# Patient Record
Sex: Female | Born: 1960
Health system: Southern US, Community
[De-identification: ages and names within clinical notes are randomized; demographics above are authoritative.]

## PROBLEM LIST (undated history)

## (undated) DIAGNOSIS — Z9889 Other specified postprocedural states: Secondary | ICD-10-CM

## (undated) DIAGNOSIS — M1990 Unspecified osteoarthritis, unspecified site: Secondary | ICD-10-CM

## (undated) DIAGNOSIS — J309 Allergic rhinitis, unspecified: Secondary | ICD-10-CM

## (undated) DIAGNOSIS — Z8249 Family history of ischemic heart disease and other diseases of the circulatory system: Secondary | ICD-10-CM

## (undated) DIAGNOSIS — Z85828 Personal history of other malignant neoplasm of skin: Secondary | ICD-10-CM

## (undated) DIAGNOSIS — Z973 Presence of spectacles and contact lenses: Secondary | ICD-10-CM

## (undated) DIAGNOSIS — N926 Irregular menstruation, unspecified: Secondary | ICD-10-CM

## (undated) DIAGNOSIS — N840 Polyp of corpus uteri: Secondary | ICD-10-CM

## (undated) DIAGNOSIS — T7840XA Allergy, unspecified, initial encounter: Secondary | ICD-10-CM

## (undated) DIAGNOSIS — C801 Malignant (primary) neoplasm, unspecified: Secondary | ICD-10-CM

## (undated) HISTORY — DX: Family history of ischemic heart disease and other diseases of the circulatory system: Z82.49

## (undated) HISTORY — DX: Allergic rhinitis, unspecified: J30.9

## (undated) HISTORY — PX: DENTAL SURGERY: SHX609

## (undated) HISTORY — DX: Allergy, unspecified, initial encounter: T78.40XA

## (undated) HISTORY — PX: HYMENECTOMY: SHX987

## (undated) HISTORY — PX: OTHER SURGICAL HISTORY: SHX169

## (undated) HISTORY — DX: Malignant (primary) neoplasm, unspecified: C80.1

## (undated) HISTORY — DX: Unspecified osteoarthritis, unspecified site: M19.90

## (undated) HISTORY — DX: Irregular menstruation, unspecified: N92.6

---

## 1998-03-17 ENCOUNTER — Other Ambulatory Visit: Admission: RE | Admit: 1998-03-17 | Discharge: 1998-03-17 | Payer: Self-pay | Admitting: Obstetrics and Gynecology

## 1999-06-03 ENCOUNTER — Other Ambulatory Visit: Admission: RE | Admit: 1999-06-03 | Discharge: 1999-06-03 | Payer: Self-pay | Admitting: Obstetrics and Gynecology

## 2000-07-02 ENCOUNTER — Other Ambulatory Visit: Admission: RE | Admit: 2000-07-02 | Discharge: 2000-07-02 | Payer: Self-pay | Admitting: Obstetrics and Gynecology

## 2001-07-22 ENCOUNTER — Other Ambulatory Visit: Admission: RE | Admit: 2001-07-22 | Discharge: 2001-07-22 | Payer: Self-pay | Admitting: Obstetrics and Gynecology

## 2002-01-14 ENCOUNTER — Other Ambulatory Visit: Admission: RE | Admit: 2002-01-14 | Discharge: 2002-01-14 | Payer: Self-pay | Admitting: Obstetrics and Gynecology

## 2002-09-04 ENCOUNTER — Other Ambulatory Visit: Admission: RE | Admit: 2002-09-04 | Discharge: 2002-09-04 | Payer: Self-pay | Admitting: Obstetrics and Gynecology

## 2003-03-03 ENCOUNTER — Other Ambulatory Visit: Admission: RE | Admit: 2003-03-03 | Discharge: 2003-03-03 | Payer: Self-pay | Admitting: Obstetrics and Gynecology

## 2003-03-11 ENCOUNTER — Encounter: Admission: RE | Admit: 2003-03-11 | Discharge: 2003-03-11 | Payer: Self-pay | Admitting: Obstetrics and Gynecology

## 2003-09-21 ENCOUNTER — Other Ambulatory Visit: Admission: RE | Admit: 2003-09-21 | Discharge: 2003-09-21 | Payer: Self-pay | Admitting: Obstetrics and Gynecology

## 2004-02-25 ENCOUNTER — Ambulatory Visit: Payer: Self-pay | Admitting: Internal Medicine

## 2004-10-11 ENCOUNTER — Other Ambulatory Visit: Admission: RE | Admit: 2004-10-11 | Discharge: 2004-10-11 | Payer: Self-pay | Admitting: Obstetrics and Gynecology

## 2005-01-05 ENCOUNTER — Encounter: Admission: RE | Admit: 2005-01-05 | Discharge: 2005-01-05 | Payer: Self-pay | Admitting: Obstetrics and Gynecology

## 2005-02-17 ENCOUNTER — Ambulatory Visit: Payer: Self-pay | Admitting: Internal Medicine

## 2005-07-20 ENCOUNTER — Encounter: Admission: RE | Admit: 2005-07-20 | Discharge: 2005-07-20 | Payer: Self-pay | Admitting: Obstetrics and Gynecology

## 2006-12-20 ENCOUNTER — Telehealth: Payer: Self-pay | Admitting: Internal Medicine

## 2007-05-15 ENCOUNTER — Encounter: Payer: Self-pay | Admitting: *Deleted

## 2007-05-15 DIAGNOSIS — Z9189 Other specified personal risk factors, not elsewhere classified: Secondary | ICD-10-CM | POA: Insufficient documentation

## 2007-05-15 DIAGNOSIS — N926 Irregular menstruation, unspecified: Secondary | ICD-10-CM | POA: Insufficient documentation

## 2007-05-15 DIAGNOSIS — J309 Allergic rhinitis, unspecified: Secondary | ICD-10-CM | POA: Insufficient documentation

## 2010-06-22 ENCOUNTER — Encounter: Payer: Self-pay | Admitting: Internal Medicine

## 2010-06-23 ENCOUNTER — Ambulatory Visit (INDEPENDENT_AMBULATORY_CARE_PROVIDER_SITE_OTHER): Payer: BC Managed Care – PPO | Admitting: Internal Medicine

## 2010-06-23 VITALS — BP 122/78 | HR 59 | Temp 98.7°F | Wt 123.0 lb

## 2010-06-23 DIAGNOSIS — R591 Generalized enlarged lymph nodes: Secondary | ICD-10-CM

## 2010-06-23 DIAGNOSIS — R599 Enlarged lymph nodes, unspecified: Secondary | ICD-10-CM

## 2010-06-25 DIAGNOSIS — R591 Generalized enlarged lymph nodes: Secondary | ICD-10-CM | POA: Insufficient documentation

## 2010-06-25 NOTE — Progress Notes (Signed)
  Subjective:    Patient ID: Erin Stokes, female    DOB: 06/16/1960, 50 y.o.   MRN: [id number]  HPI Erin Stokes presents for evaluation of lymphadenopathy of the neck. She noticed an enlarged node at the right posterior neck 5 cm below the hairline and a second smaller node at the hairline, the later being slightly tender. She has had no prodromal illness, no scalp infections, cut or injuries, no new hair treatments. She denies any fevers, sweats, weight loss or other signs of illness. She reports that she did have transiently enlarged nodes in the anterior cervical chain and in the submandibular region that has resolved. She has had no dental problems, sore throat or skin infections.   Erin Stokes has not been seen for a decade plus. She reports no major illness, surgery or injury in the interval. She does report that she is current with breast health and gyn care.   Past Medical History  Diagnosis Date  . Family history of ischemic heart disease   . Irregular menstrual cycle   . History of chickenpox   . Allergic rhinitis, cause unspecified    Past Surgical History  Procedure Date  . Hymenectomy     Age 72   Will need to pull paper chart for additional history: family and social hx.        Review of Systems Review of Systems  Constitutional:  Negative for fever, chills, activity change and unexpected weight change.  HEENT:  Negative for hearing loss, ear pain, congestion, neck stiffness and postnasal drip. Negative for sore throat or swallowing problems. Negative for dental complaints.   Eyes: Negative for vision loss or change in visual acuity.  Respiratory: Negative for chest tightness and wheezing.   Cardiovascular: Negative for chest pain and palpitation. No decreased exercise tolerance Gastrointestinal: No change in bowel habit. No bloating or gas. No reflux or indigestion Genitourinary: Negative for urgency, frequency, flank pain and difficulty urinating.    Musculoskeletal: Negative for myalgias, back pain, arthralgias and gait problem.  Neurological: Negative for dizziness, tremors, weakness and headaches.  Hematological: Negative for adenopathy.  Psychiatric/Behavioral: Negative for behavioral problems and dysphoric mood.       Objective:   Physical Exam Vitals reviewed - afebrile Gen'l - WNWD white woman who looks younger than her stated age in no distress HEENT [place]/AT, EACs/TMs nl, no oral lesions Neck - supple, no thyromegaly. Nodes - 1 cm node right neck at the hair line, 2 cm node posterior cervical chain - soft, mobile, not tender, shotty submandibular and anterior cerivcal areas, no supraclavicular or axillary nodes. Lungs Clear Cor - RRR Derm - no lesions face,neck, arms.        Assessment & Plan:

## 2010-06-25 NOTE — Assessment & Plan Note (Signed)
Patient presents with isolated adenopathy with no signs of illness and a negative exam. May represent transient viral lymphadenitis vs other such as lymphoma.  Plan - continue use NSAIDs over the next several days.             If no improvement - smaller nodes - will refer to Narda Bonds for consideration of biopsy, either needle or excisional.

## 2010-06-27 ENCOUNTER — Telehealth: Payer: Self-pay | Admitting: *Deleted

## 2010-06-27 NOTE — Telephone Encounter (Signed)
Pt called to update the status of neck: Lumps have subsided in size & pain is almost gone. Pt does not feel that it is necessary for ROV for this matter.

## 2010-06-27 NOTE — Telephone Encounter (Signed)
Cervical nodes resolving. NO follow-up needed

## 2010-11-29 ENCOUNTER — Encounter: Payer: Self-pay | Admitting: Gastroenterology

## 2010-12-05 ENCOUNTER — Encounter: Payer: Self-pay | Admitting: Internal Medicine

## 2010-12-05 ENCOUNTER — Ambulatory Visit (INDEPENDENT_AMBULATORY_CARE_PROVIDER_SITE_OTHER): Payer: BC Managed Care – PPO | Admitting: Internal Medicine

## 2010-12-05 VITALS — BP 100/72 | HR 71 | Temp 98.4°F

## 2010-12-05 DIAGNOSIS — J069 Acute upper respiratory infection, unspecified: Secondary | ICD-10-CM

## 2010-12-05 MED ORDER — IPRATROPIUM BROMIDE 0.03 % NA SOLN: 2.0000 | Freq: Three times a day (TID) | NASAL | Status: DC

## 2010-12-05 MED ORDER — HYDROCODONE-HOMATROPINE 5-1.5 MG/5ML PO SYRP: 5.0000 mL | ORAL_SOLUTION | Freq: Four times a day (QID) | ORAL | Status: AC | PRN

## 2010-12-05 NOTE — Progress Notes (Signed)
  Subjective:    Patient ID: Erin Stokes, female    DOB: 1960-03-04, 50 y.o.   MRN: [id number]  HPI Complains of flulike symptoms: Sore throat, runny nose, myalgias Onset 5 days ago, wax/wane intensity Precipitated by sick contacts at home Symptoms not improved with over-the-counter medications  Past Medical History  Diagnosis Date  . Family history of ischemic heart disease   . Irregular menstrual cycle   . History of chickenpox   . Allergic rhinitis, cause unspecified     Review of Systems  Constitutional: Positive for fever (low grade) and chills.  HENT: Negative for nosebleeds, congestion, neck pain and sinus pressure.   Respiratory: Positive for wheezing. Negative for shortness of breath.   Genitourinary: Negative for dysuria.  Neurological: Negative for dizziness and light-headedness.       Objective:   Physical Exam BP 100/72  Pulse 71  Temp(Src) 98.4 F (36.9 C) (Oral)  SpO2 99% Wt Readings from Last 3 Encounters:  06/23/10 123 lb (55.792 kg)  02/25/04 128 lb (58.06 kg)   Constitutional: She appears well-developed and well-nourished. Mildly ill but no distress.  HENT: Head: Normocephalic and atraumatic. Ears: B TMs ok, no erythema or effusion; Nose: ruddy with clear rhinnorhea.  Mouth/Throat: Oropharynx with mild erythema, clear and moist. No oropharyngeal exudate.  Eyes: Conjunctivae and EOM are normal. Pupils are equal, round, and reactive to light. No scleral icterus.  Neck: Normal range of motion. Neck supple. No JVD present. No thyromegaly present.  Cardiovascular: Normal rate, regular rhythm and normal heart sounds.  No murmur heard. No BLE edema. Pulmonary/Chest: Effort normal and breath sounds normal. No respiratory distress. She has no wheezes.   No results found for this basename: WBC, HGB, HCT, PLT, GLUCOSE, CHOL, TRIG, HDL, LDLDIRECT, LDLCALC, ALT, AST, NA, K, CL, CREATININE, BUN, CO2, TSH, PSA, INR, GLUF, HGBA1C, MICROALBUR       Assessment &  Plan:  Viral syndrome - explain lack of efficacy of antibiotics in viral disease Symptomatic care advised and prescribed - Atrovent nasal spray for rhinorrhea and Hydromet for cough Continue ibuprofen, hydration and rest Patient to call symptoms worse or unimproved

## 2010-12-05 NOTE — Patient Instructions (Signed)
It was good to see you today. If you develop worsening symptoms or fever, call and we can reconsider antibiotics, but it does not appear necessary to use antibiotics at this time. For your cold, will use medications to treat symptoms: Atrovent nasal spray for the runny nose and Hydromet cough syrup as needed Your prescription(s) have been submitted to your pharmacy. Please take as directed and contact our office if you believe you are having problem(s) with the medication(s). Continue Advil as needed, plenty of hydration and to rest If symptoms worse or unimproved after next 7-10 days, call for reevaluation as needed

## 2010-12-08 ENCOUNTER — Telehealth: Payer: Self-pay

## 2010-12-08 NOTE — Telephone Encounter (Signed)
Called pt: right eye at the medial canthus with watery drainage, erythema. No itching , no pain, no change in vision. Mild irritation vs allergic vs viral.  Plan - otc visine for comfort            Warm compress            For itching, pain, matting - needs ov

## 2010-12-08 NOTE — Telephone Encounter (Signed)
Pt called stating she has developed redness and constant in her right eye. Pt denies itching or discharge (states viral URI sxs improving in spite of this). Pt is requesting advisement.

## 2010-12-21 ENCOUNTER — Other Ambulatory Visit: Payer: BC Managed Care – PPO | Admitting: Gastroenterology

## 2012-04-08 ENCOUNTER — Ambulatory Visit: Payer: BC Managed Care – PPO | Admitting: Internal Medicine

## 2012-04-09 ENCOUNTER — Ambulatory Visit (INDEPENDENT_AMBULATORY_CARE_PROVIDER_SITE_OTHER): Payer: BC Managed Care – PPO | Admitting: Internal Medicine

## 2012-04-09 ENCOUNTER — Encounter: Payer: Self-pay | Admitting: Internal Medicine

## 2012-04-09 VITALS — BP 118/76 | HR 92 | Temp 98.1°F | Ht 66.0 in | Wt 126.0 lb

## 2012-04-09 DIAGNOSIS — Z0000 Encounter for general adult medical examination without abnormal findings: Secondary | ICD-10-CM

## 2012-04-09 DIAGNOSIS — J209 Acute bronchitis, unspecified: Secondary | ICD-10-CM

## 2012-04-09 MED ORDER — AZITHROMYCIN 250 MG PO TABS: ORAL_TABLET | ORAL | Status: DC

## 2012-04-09 NOTE — Progress Notes (Signed)
  Subjective:    Patient ID: Erin Stokes, female    DOB: 03-20-1960, 52 y.o.   MRN: [id number]  HPI Erin Stokes presents with a many day h/o cough that is productive of opaque sputum post nasal drainage, low grade fever and a feeling chest congestion. No rigors, SOB/DOE, enlarged nodes, N/V/D. She is fatigued but able to do all her usual activities.   Past Medical History  Diagnosis Date  . Family history of ischemic heart disease   . Irregular menstrual cycle   . History of chickenpox   . Allergic rhinitis, cause unspecified    Past Surgical History  Procedure Laterality Date  . Hymenectomy      Age 19  . Basal cell cancer      removed from nose in October 2013   History reviewed. No pertinent family history. History   Social History  . Marital Status: Legally Separated    Spouse Name: N/A    Number of Children: N/A  . Years of Education: N/A   Occupational History  . Not on file.   Social History Main Topics  . Smoking status: Never Smoker   . Smokeless tobacco: Not on file  . Alcohol Use: Yes     Comment: 1 drink per day  . Drug Use: No  . Sexually Active: Not on file   Other Topics Concern  . Not on file   Social History Narrative  . No narrative on file    Current Outpatient Prescriptions on File Prior to Visit  Medication Sig Dispense Refill  . ibuprofen (ADVIL,MOTRIN) 100 MG tablet Take 100 mg by mouth every 6 (six) hours as needed.        . loratadine (CLARITIN) 10 MG tablet Take 10 mg by mouth daily.        Marland Kitchen ipratropium (ATROVENT) 0.03 % nasal spray Place 2 sprays into the nose 3 (three) times daily.  30 mL  2   No current facility-administered medications on file prior to visit.      Review of Systems System review is negative for any constitutional, cardiac, pulmonary, GI or neuro symptoms or complaints other than as described in the HPI.     Objective:   Physical Exam Filed Vitals:   04/09/12 1130  BP: 118/76  Pulse: 92  Temp: 98.1 F  (36.7 C)   Wt Readings from Last 3 Encounters:  04/09/12 126 lb (57.153 kg)  06/23/10 123 lb (55.792 kg)  02/25/04 128 lb (58.06 kg)   Gen'l- a slender woman in no distress HEENT- surgical change tip of nose; no tenderness to percussion frontal or maxillary sinus, throat clear, TMs normal Neck- supple Nodes - negative Cor - RRR Pulm - Good breath sounds w/o rales.       Assessment & Plan:  Bronchitis - early stage. Lungs are clear - no pneumonia; sinus without tenderness.  Plan Z-pak as directed  Supportive care: hydration, vitamin C 1500 mg, ecchinacea, rest, robitussin DM (or the equivalent),  Avoid decongestant.   Will return for CPX

## 2012-04-09 NOTE — Patient Instructions (Addendum)
Bronchitis - early stage. Lungs are clear - no pneumonia; sinus without tenderness.  Plan Z-pak as directed  Supportive care: hydration, vitamin C 1500 mg, ecchinacea, rest, robitussin DM (or the equivalent),  Avoid decongestant.   Acute Bronchitis You have acute bronchitis. This means you have a chest cold. The airways in your lungs are red and sore (inflamed). Acute means it is sudden onset.  CAUSES Bronchitis is most often caused by the same virus that causes a cold. SYMPTOMS   Body aches.  Chest congestion.  Chills.  Cough.  Fever.  Shortness of breath.  Sore throat. TREATMENT  Acute bronchitis is usually treated with rest, fluids, and medicines for relief of fever or cough. Most symptoms should go away after a few days or a week. Increased fluids may help thin your secretions and will prevent dehydration. Your caregiver may give you an inhaler to improve your symptoms. The inhaler reduces shortness of breath and helps control cough. You can take over-the-counter pain relievers or cough medicine to decrease coughing, pain, or fever. A cool-air vaporizer may help thin bronchial secretions and make it easier to clear your chest. Antibiotics are usually not needed but can be prescribed if you smoke, are seriously ill, have chronic lung problems, are elderly, or you are at higher risk for developing complications.Allergies and asthma can make bronchitis worse. Repeated episodes of bronchitis may cause longstanding lung problems. Avoid smoking and secondhand smoke.Exposure to cigarette smoke or irritating chemicals will make bronchitis worse. If you are a cigarette smoker, consider using nicotine gum or skin patches to help control withdrawal symptoms. Quitting smoking will help your lungs heal faster. Recovery from bronchitis is often slow, but you should start feeling better after 2 to 3 days. Cough from bronchitis frequently lasts for 3 to 4 weeks. To prevent another bout of acute  bronchitis:  Quit smoking.  Wash your hands frequently to get rid of viruses or use a hand sanitizer.  Avoid other people with cold or virus symptoms.  Try not to touch your hands to your mouth, nose, or eyes. SEEK IMMEDIATE MEDICAL CARE IF:  You develop increased fever, chills, or chest pain.  You have severe shortness of breath or bloody sputum.  You develop dehydration, fainting, repeated vomiting, or a severe headache.  You have no improvement after 1 week of treatment or you get worse. MAKE SURE YOU:   Understand these instructions.  Will watch your condition.  Will get help right away if you are not doing well or get worse. Document Released: 02/03/2004 Document Revised: 03/20/2011 Document Reviewed: 04/20/2010 [facility] Patient Information 2013 [place], [place].

## 2012-08-27 ENCOUNTER — Encounter: Payer: Self-pay | Admitting: Gastroenterology

## 2012-09-25 ENCOUNTER — Other Ambulatory Visit: Payer: Self-pay | Admitting: Obstetrics and Gynecology

## 2012-09-25 DIAGNOSIS — N63 Unspecified lump in unspecified breast: Secondary | ICD-10-CM

## 2012-10-01 ENCOUNTER — Encounter: Payer: Self-pay | Admitting: Gastroenterology

## 2012-10-01 ENCOUNTER — Ambulatory Visit (AMBULATORY_SURGERY_CENTER): Payer: Self-pay | Admitting: *Deleted

## 2012-10-01 VITALS — Ht 66.0 in | Wt 121.6 lb

## 2012-10-01 DIAGNOSIS — Z1211 Encounter for screening for malignant neoplasm of colon: Secondary | ICD-10-CM

## 2012-10-01 MED ORDER — PEG-KCL-NACL-NASULF-NA ASC-C 100 G PO SOLR: ORAL | Status: DC

## 2012-10-01 NOTE — Progress Notes (Signed)
No egg or soy allergy 

## 2012-10-07 ENCOUNTER — Encounter: Payer: Self-pay | Admitting: Internal Medicine

## 2012-10-08 ENCOUNTER — Ambulatory Visit
Admission: RE | Admit: 2012-10-08 | Discharge: 2012-10-08 | Disposition: A | Discharge status: Home / Self Care | Payer: BC Managed Care – PPO | Source: Ambulatory Visit | Attending: Obstetrics and Gynecology | Admitting: Obstetrics and Gynecology

## 2012-10-08 DIAGNOSIS — N63 Unspecified lump in unspecified breast: Secondary | ICD-10-CM

## 2012-10-14 ENCOUNTER — Encounter: Payer: Self-pay | Admitting: Gastroenterology

## 2012-10-14 ENCOUNTER — Ambulatory Visit (AMBULATORY_SURGERY_CENTER): Payer: BC Managed Care – PPO | Admitting: Gastroenterology

## 2012-10-14 VITALS — BP 126/81 | HR 55 | Temp 96.5°F | Resp 16 | Ht 66.0 in | Wt 121.0 lb

## 2012-10-14 DIAGNOSIS — Z1211 Encounter for screening for malignant neoplasm of colon: Secondary | ICD-10-CM

## 2012-10-14 DIAGNOSIS — D175 Benign lipomatous neoplasm of intra-abdominal organs: Secondary | ICD-10-CM

## 2012-10-14 MED ORDER — SODIUM CHLORIDE 0.9 % IV SOLN: 500.0000 mL | INTRAVENOUS | Status: DC

## 2012-10-14 NOTE — Progress Notes (Signed)
Patients left eye was bloodshot from admission to recovery. Patient reports a historyof the eye being reddened since last evening. Patient reporting that the eye has been blood shot like this today in the past.

## 2012-10-14 NOTE — Op Note (Signed)
[facility] [address] [place], [address]   COLONOSCOPY PROCEDURE REPORT  PATIENT: Erin Stokes, Erin Stokes  MR#: [id number] BIRTHDATE: [birth year] , 51  yrs. old GENDER: Female ENDOSCOPIST: Mardella Layman, MD, [facility] REFERRED BY: PROCEDURE DATE:  10/14/2012 PROCEDURE:   Colonoscopy, screening First Screening Colonoscopy - Avg.  risk and is 50 yrs.  old or older Yes.  Prior Negative Screening - Now for repeat screening. N/A  History of Adenoma - Now for follow-up colonoscopy & has been > or = to 3 yrs.  N/A ASA CLASS:   Class I INDICATIONS:average risk screening. MEDICATIONS: propofol (Diprivan) 250mg  IV  DESCRIPTION OF PROCEDURE:   After the risks benefits and alternatives of the procedure were thoroughly explained, informed consent was obtained.  A digital rectal exam revealed no abnormalities of the rectum.   The LB [id number]  endoscope was introduced through the anus and advanced to the cecum, which was identified by both the appendix and ileocecal valve. No adverse events experienced.   The quality of the prep was excellent, using MoviPrep  The instrument was then slowly withdrawn as the colon was fully examined.      COLON FINDINGS: 1.5 cm smooth and soft peduncalated  lipoma was found in the descending colon.See Pictures Above... Otherwise normal exam, but very,very tortuous colon noted .  Retroflexed views revealed no abnormalities. The time to cecum=8 minutes 31 seconds.  Withdrawal time=8 minutes 16 seconds.  The scope was withdrawn and the procedure completed. COMPLICATIONS: There were no complications.  ENDOSCOPIC IMPRESSION: Lpoma in the descending colon; otherwise normal but tortuous and redundant colon noted. RECOMMENDATIONS: 1.  Repeat Colonoscopy in 5 years. 2.  Continue current medications   eSigned:  Mardella Layman, MD, [facility] 10/14/2012 10:36 AM   cc:

## 2012-10-14 NOTE — Progress Notes (Signed)
PT. Right eye with redness upon admitting.

## 2012-10-14 NOTE — Progress Notes (Signed)
Patient did not experience any of the following events: a burn prior to discharge; a fall within the facility; wrong site/side/patient/procedure/implant event; or a hospital transfer or hospital admission upon discharge from the facility. (G8907) Patient did not have preoperative order for IV antibiotic SSI prophylaxis. (G8918)  

## 2012-10-14 NOTE — Patient Instructions (Signed)
YOU HAD AN ENDOSCOPIC PROCEDURE TODAY AT THE [facility]: Refer to the procedure report that was given to you for any specific questions about what was found during the examination.  If the procedure report does not answer your questions, please call your gastroenterologist to clarify.  If you requested that your care partner not be given the details of your procedure findings, then the procedure report has been included in a sealed envelope for you to review at your convenience later.  YOU SHOULD EXPECT: Some feelings of bloating in the abdomen. Passage of more gas than usual.  Walking can help get rid of the air that was put into your GI tract during the procedure and reduce the bloating. If you had a lower endoscopy (such as a colonoscopy or flexible sigmoidoscopy) you may notice spotting of blood in your stool or on the toilet paper. If you underwent a bowel prep for your procedure, then you may not have a normal bowel movement for a few days.  DIET: Your first meal following the procedure should be a light meal and then it is ok to progress to your normal diet.  A half-sandwich or bowl of soup is an example of a good first meal.  Heavy or fried foods are harder to digest and may make you feel nauseous or bloated.  Likewise meals heavy in dairy and vegetables can cause extra gas to form and this can also increase the bloating.  Drink plenty of fluids but you should avoid alcoholic beverages for 24 hours.  ACTIVITY: Your care partner should take you home directly after the procedure.  You should plan to take it easy, moving slowly for the rest of the day.  You can resume normal activity the day after the procedure however you should NOT DRIVE or use heavy machinery for 24 hours (because of the sedation medicines used during the test).    SYMPTOMS TO REPORT IMMEDIATELY: A gastroenterologist can be reached at any hour.  During normal business hours, 8:30 AM to 5:00 PM Monday through Friday,  call [phone].  After hours and on weekends, please call the GI answering service at [phone] who will take a message and have the physician on call contact you.   Following lower endoscopy (colonoscopy or flexible sigmoidoscopy):  Excessive amounts of blood in the stool  Significant tenderness or worsening of abdominal pains  Swelling of the abdomen that is new, acute  Fever of 100F or higher    FOLLOW UP: If any biopsies were taken you will be contacted by phone or by letter within the next 1-3 weeks.  Call your gastroenterologist if you have not heard about the biopsies in 3 weeks.  Our staff will call the home number listed on your records the next business day following your procedure to check on you and address any questions or concerns that you may have at that time regarding the information given to you following your procedure. This is a courtesy call and so if there is no answer at the home number and we have not heard from you through the emergency physician on call, we will assume that you have returned to your regular daily activities without incident.  SIGNATURES/CONFIDENTIALITY: You and/or your care partner have signed paperwork which will be entered into your electronic medical record.  These signatures attest to the fact that that the information above on your After Visit Summary has been reviewed and is understood.  Full responsibility of the confidentiality   of this discharge information lies with you and/or your care-partner.     

## 2012-10-14 NOTE — Progress Notes (Signed)
Procedure ends, to recovery, report given and VSS. 

## 2012-10-15 ENCOUNTER — Telehealth: Payer: Self-pay | Admitting: *Deleted

## 2012-10-15 NOTE — Telephone Encounter (Signed)
  Follow up Call-  Call back number 10/14/2012  Post procedure Call Back phone  # [phone]  Permission to leave phone message Yes     Patient questions:  Do you have a fever, pain , or abdominal swelling? no Pain Score  0 *  Have you tolerated food without any problems? yes  Have you been able to return to your normal activities? yes  Do you have any questions about your discharge instructions: Diet   no Medications  no Follow up visit  no  Do you have questions or concerns about your Care? no  Actions: * If pain score is 4 or above: No action needed, pain <4.

## 2012-11-14 ENCOUNTER — Other Ambulatory Visit: Payer: Self-pay

## 2013-02-20 ENCOUNTER — Other Ambulatory Visit: Payer: Self-pay | Admitting: Obstetrics and Gynecology

## 2013-02-20 DIAGNOSIS — N63 Unspecified lump in unspecified breast: Secondary | ICD-10-CM

## 2013-03-05 ENCOUNTER — Ambulatory Visit
Admission: RE | Admit: 2013-03-05 | Discharge: 2013-03-05 | Disposition: A | Discharge status: Home / Self Care | Payer: BC Managed Care – PPO | Source: Ambulatory Visit | Attending: Obstetrics and Gynecology | Admitting: Obstetrics and Gynecology

## 2013-03-05 ENCOUNTER — Other Ambulatory Visit: Payer: Self-pay | Admitting: Obstetrics and Gynecology

## 2013-03-05 DIAGNOSIS — N6320 Unspecified lump in the left breast, unspecified quadrant: Secondary | ICD-10-CM

## 2013-03-05 DIAGNOSIS — N644 Mastodynia: Secondary | ICD-10-CM

## 2013-03-05 DIAGNOSIS — N63 Unspecified lump in unspecified breast: Secondary | ICD-10-CM

## 2013-03-05 DIAGNOSIS — R921 Mammographic calcification found on diagnostic imaging of breast: Secondary | ICD-10-CM

## 2013-04-07 ENCOUNTER — Telehealth: Payer: Self-pay

## 2013-04-07 MED ORDER — IPRATROPIUM BROMIDE 0.03 % NA SOLN: 2.0000 | Freq: Three times a day (TID) | NASAL | Status: DC

## 2013-04-07 NOTE — Telephone Encounter (Signed)
Notified pt rx sent to [facility].../lmb 

## 2013-04-07 NOTE — Telephone Encounter (Signed)
The patient called hoping to get a refill of her Atrovent nose spray.  She stated she believes Dr.Leschber originailly filled this medication.     Callback -[phone]

## 2013-04-08 ENCOUNTER — Other Ambulatory Visit: Payer: BC Managed Care – PPO

## 2013-07-23 ENCOUNTER — Ambulatory Visit (INDEPENDENT_AMBULATORY_CARE_PROVIDER_SITE_OTHER): Payer: BC Managed Care – PPO | Admitting: Licensed Clinical Social Worker

## 2013-07-23 DIAGNOSIS — F4323 Adjustment disorder with mixed anxiety and depressed mood: Secondary | ICD-10-CM

## 2013-08-06 ENCOUNTER — Ambulatory Visit (INDEPENDENT_AMBULATORY_CARE_PROVIDER_SITE_OTHER): Payer: BC Managed Care – PPO | Admitting: Licensed Clinical Social Worker

## 2013-08-06 DIAGNOSIS — F4323 Adjustment disorder with mixed anxiety and depressed mood: Secondary | ICD-10-CM

## 2013-08-20 ENCOUNTER — Ambulatory Visit (INDEPENDENT_AMBULATORY_CARE_PROVIDER_SITE_OTHER): Payer: BC Managed Care – PPO | Admitting: Licensed Clinical Social Worker

## 2013-08-20 DIAGNOSIS — F4323 Adjustment disorder with mixed anxiety and depressed mood: Secondary | ICD-10-CM

## 2013-09-10 ENCOUNTER — Ambulatory Visit: Payer: BC Managed Care – PPO | Admitting: Licensed Clinical Social Worker

## 2013-10-10 ENCOUNTER — Ambulatory Visit (INDEPENDENT_AMBULATORY_CARE_PROVIDER_SITE_OTHER): Payer: BC Managed Care – PPO | Admitting: Licensed Clinical Social Worker

## 2013-10-10 DIAGNOSIS — F4323 Adjustment disorder with mixed anxiety and depressed mood: Secondary | ICD-10-CM

## 2013-11-10 ENCOUNTER — Ambulatory Visit (INDEPENDENT_AMBULATORY_CARE_PROVIDER_SITE_OTHER): Payer: BC Managed Care – PPO | Admitting: Licensed Clinical Social Worker

## 2013-11-10 DIAGNOSIS — F4323 Adjustment disorder with mixed anxiety and depressed mood: Secondary | ICD-10-CM

## 2013-11-20 ENCOUNTER — Other Ambulatory Visit (INDEPENDENT_AMBULATORY_CARE_PROVIDER_SITE_OTHER): Payer: BC Managed Care – PPO

## 2013-11-20 ENCOUNTER — Ambulatory Visit (INDEPENDENT_AMBULATORY_CARE_PROVIDER_SITE_OTHER): Payer: BC Managed Care – PPO | Admitting: Internal Medicine

## 2013-11-20 ENCOUNTER — Encounter: Payer: Self-pay | Admitting: Internal Medicine

## 2013-11-20 VITALS — BP 104/78 | HR 73 | Temp 98.3°F | Resp 12 | Ht 65.0 in | Wt 120.4 lb

## 2013-11-20 DIAGNOSIS — Z0000 Encounter for general adult medical examination without abnormal findings: Secondary | ICD-10-CM

## 2013-11-20 DIAGNOSIS — J302 Other seasonal allergic rhinitis: Secondary | ICD-10-CM

## 2013-11-20 LAB — LIPID PANEL
CHOLESTEROL: 243 mg/dL — AB (ref 0–200)
HDL: 116.8 mg/dL (ref >39.00)
LDL Cholesterol: 120 mg/dL — ABNORMAL HIGH (ref 0–99)
NonHDL: 126.2
TRIGLYCERIDES: 32 mg/dL (ref 0.0–149.0)
Total CHOL/HDL Ratio: 2
VLDL: 6.4 mg/dL (ref 0.0–40.0)

## 2013-11-20 LAB — BASIC METABOLIC PANEL
BUN: 16 mg/dL (ref 6–23)
CALCIUM: 9.5 mg/dL (ref 8.4–10.5)
CO2: 26 mEq/L (ref 19–32)
Chloride: 103 mEq/L (ref 96–112)
Creatinine, Ser: 0.8 mg/dL (ref 0.4–1.2)
GFR: 79.75 mL/min (ref >60.00)
Glucose, Bld: 106 mg/dL — ABNORMAL HIGH (ref 70–99)
POTASSIUM: 4.4 meq/L (ref 3.5–5.1)
Sodium: 137 mEq/L (ref 135–145)

## 2013-11-20 MED ORDER — IPRATROPIUM BROMIDE 0.03 % NA SOLN: 2.0000 | Freq: Three times a day (TID) | NASAL | Status: DC

## 2013-11-20 NOTE — Progress Notes (Signed)
   Subjective:    Patient ID: Erin Stokes, female    DOB: 02/15/1960, 53 y.o.   MRN: [id number]  HPI The patient is a 53 year old female who comes in today to establish care. She has past medical history significant only for allergies. She is up-to-date on Pap smear, mammogram. She has no complaints. She denies chest pains, shortness of breath, abdominal pain. She exercises several times daily. Her diet is healthy. She denies major joint pains although she does have some discomfort in her knees with exercise. She takes ibuprofen occasionally for that but tries not to take something every day. She denies any falls at home, balance problems.  Review of Systems  Constitutional: Negative for fever, chills, activity change, appetite change, fatigue and unexpected weight change.  HENT: Negative.   Eyes: Negative.   Respiratory: Negative for cough, chest tightness, shortness of breath and wheezing.   Cardiovascular: Negative for chest pain, palpitations and leg swelling.  Gastrointestinal: Negative for abdominal pain, diarrhea, constipation and abdominal distention.  Musculoskeletal: Positive for arthralgias. Negative for myalgias and gait problem.  Skin: Negative.   Neurological: Negative.   Psychiatric/Behavioral: Negative.       Objective:   Physical Exam  Constitutional: She is oriented to person, place, and time. She appears well-developed and well-nourished.  HENT:  Head: Normocephalic and atraumatic.  Eyes: EOM are normal.  Neck: Normal range of motion.  Cardiovascular: Normal rate, regular rhythm and intact distal pulses.   No murmur heard. Pulmonary/Chest: Effort normal and breath sounds normal. No respiratory distress. She has no wheezes. She has no rales.  Abdominal: Soft. Bowel sounds are normal.  Musculoskeletal: She exhibits no edema or tenderness.  Neurological: She is alert and oriented to person, place, and time. Coordination normal.  Skin: Skin is warm and dry.   Filed  Vitals:   11/20/13 1109 11/20/13 1128  BP: 106/98 104/78  Pulse: 73   Temp: 98.3 F (36.8 C)   TempSrc: Oral   Resp: 12   Height: 5\' 5"  (1.651 m)   Weight: 120 lb 6.4 oz (54.613 kg)   SpO2: 97%       Assessment & Plan:

## 2013-11-20 NOTE — Assessment & Plan Note (Signed)
Pap smear within the last year, mammogram last done in February was normal. She just declines flu shot. Up-to-date on tetanus shot. She regularly exercises and has a good diet. Check lipid panel, BMP today.

## 2013-11-20 NOTE — Progress Notes (Signed)
Pre visit review using our clinic review tool, if applicable. No additional management support is needed unless otherwise documented below in the visit note. 

## 2013-11-20 NOTE — Patient Instructions (Addendum)
We will check some blood work today to make sure that your cholesterol is normal. We will call you with the results.  We have refilled your medicine and if you have any problems or questions please feel free to call our office.  Come back in about 1-2 years.  Health Maintenance Adopting a healthy lifestyle and getting preventive care can go a long way to promote health and wellness. Talk with your health care provider about what schedule of regular examinations is right for you. This is a good chance for you to check in with your provider about disease prevention and staying healthy. In between checkups, there are plenty of things you can do on your own. Experts have done a lot of research about which lifestyle changes and preventive measures are most likely to keep you healthy. Ask your health care provider for more information. WEIGHT AND DIET  Eat a healthy diet  Be sure to include plenty of vegetables, fruits, low-fat dairy products, and lean protein.  Do not eat a lot of foods high in solid fats, added sugars, or salt.  Get regular exercise. This is one of the most important things you can do for your health.  Most adults should exercise for at least 150 minutes each week. The exercise should increase your heart rate and make you sweat (moderate-intensity exercise).  Most adults should also do strengthening exercises at least twice a week. This is in addition to the moderate-intensity exercise.  Maintain a healthy weight  Body mass index (BMI) is a measurement that can be used to identify possible weight problems. It estimates body fat based on height and weight. Your health care provider can help determine your BMI and help you achieve or maintain a healthy weight.  For females 63 years of age and older:   A BMI below 18.5 is considered underweight.  A BMI of 18.5 to 24.9 is normal.  A BMI of 25 to 29.9 is considered overweight.  A BMI of 30 and above is considered obese.   Watch levels of cholesterol and blood lipids  You should start having your blood tested for lipids and cholesterol at 53 years of age, then have this test every 5 years.  You may need to have your cholesterol levels checked more often if:  Your lipid or cholesterol levels are high.  You are older than 53 years of age.  You are at high risk for heart disease.  CANCER SCREENING   Lung Cancer  Lung cancer screening is recommended for adults 73-10 years old who are at high risk for lung cancer because of a history of smoking.  A yearly low-dose CT scan of the lungs is recommended for people who:  Currently smoke.  Have quit within the past 15 years.  Have at least a 30-pack-year history of smoking. A pack year is smoking an average of one pack of cigarettes a day for 1 year.  Yearly screening should continue until it has been 15 years since you quit.  Yearly screening should stop if you develop a health problem that would prevent you from having lung cancer treatment.  Breast Cancer  Practice breast self-awareness. This means understanding how your breasts normally appear and feel.  It also means doing regular breast self-exams. Let your health care provider know about any changes, no matter how small.  If you are in your 20s or 30s, you should have a clinical breast exam (CBE) by a health care provider every 1-3 years  as part of a regular health exam.  If you are 32 or older, have a CBE every year. Also consider having a breast X-ray (mammogram) every year.  If you have a family history of breast cancer, talk to your health care provider about genetic screening.  If you are at high risk for breast cancer, talk to your health care provider about having an MRI and a mammogram every year.  Breast cancer gene (BRCA) assessment is recommended for women who have family members with BRCA-related cancers. BRCA-related cancers  include:  Breast.  Ovarian.  Tubal.  Peritoneal cancers.  Results of the assessment will determine the need for genetic counseling and BRCA1 and BRCA2 testing. Cervical Cancer Routine pelvic examinations to screen for cervical cancer are no longer recommended for nonpregnant women who are considered low risk for cancer of the pelvic organs (ovaries, uterus, and vagina) and who do not have symptoms. A pelvic examination may be necessary if you have symptoms including those associated with pelvic infections. Ask your health care provider if a screening pelvic exam is right for you.   The Pap test is the screening test for cervical cancer for women who are considered at risk.  If you had a hysterectomy for a problem that was not cancer or a condition that could lead to cancer, then you no longer need Pap tests.  If you are older than 65 years, and you have had normal Pap tests for the past 10 years, you no longer need to have Pap tests.  If you have had past treatment for cervical cancer or a condition that could lead to cancer, you need Pap tests and screening for cancer for at least 20 years after your treatment.  If you no longer get a Pap test, assess your risk factors if they change (such as having a new sexual partner). This can affect whether you should start being screened again.  Some women have medical problems that increase their chance of getting cervical cancer. If this is the case for you, your health care provider may recommend more frequent screening and Pap tests.  The human papillomavirus (HPV) test is another test that may be used for cervical cancer screening. The HPV test looks for the virus that can cause cell changes in the cervix. The cells collected during the Pap test can be tested for HPV.  The HPV test can be used to screen women 28 years of age and older. Getting tested for HPV can extend the interval between normal Pap tests from three to five years.  An HPV  test also should be used to screen women of any age who have unclear Pap test results.  After 53 years of age, women should have HPV testing as often as Pap tests.  Colorectal Cancer  This type of cancer can be detected and often prevented.  Routine colorectal cancer screening usually begins at 53 years of age and continues through 53 years of age.  Your health care provider may recommend screening at an earlier age if you have risk factors for colon cancer.  Your health care provider may also recommend using home test kits to check for hidden blood in the stool.  A small camera at the end of a tube can be used to examine your colon directly (sigmoidoscopy or colonoscopy). This is done to check for the earliest forms of colorectal cancer.  Routine screening usually begins at age 60.  Direct examination of the colon should be repeated every  5-10 years through 53 years of age. However, you may need to be screened more often if early forms of precancerous polyps or small growths are found. Skin Cancer  Check your skin from head to toe regularly.  Tell your health care provider about any new moles or changes in moles, especially if there is a change in a mole's shape or color.  Also tell your health care provider if you have a mole that is larger than the size of a pencil eraser.  Always use sunscreen. Apply sunscreen liberally and repeatedly throughout the day.  Protect yourself by wearing long sleeves, pants, a wide-brimmed hat, and sunglasses whenever you are outside. HEART DISEASE, DIABETES, AND HIGH BLOOD PRESSURE   Have your blood pressure checked at least every 1-2 years. High blood pressure causes heart disease and increases the risk of stroke.  If you are between 2 years and 56 years old, ask your health care provider if you should take aspirin to prevent strokes.  Have regular diabetes screenings. This involves taking a blood sample to check your fasting blood sugar  level.  If you are at a normal weight and have a low risk for diabetes, have this test once every three years after 53 years of age.  If you are overweight and have a high risk for diabetes, consider being tested at a younger age or more often. PREVENTING INFECTION  Hepatitis B  If you have a higher risk for hepatitis B, you should be screened for this virus. You are considered at high risk for hepatitis B if:  You were born in a country where hepatitis B is common. Ask your health care provider which countries are considered high risk.  Your parents were born in a high-risk country, and you have not been immunized against hepatitis B (hepatitis B vaccine).  You have HIV or AIDS.  You use needles to inject street drugs.  You live with someone who has hepatitis B.  You have had sex with someone who has hepatitis B.  You get hemodialysis treatment.  You take certain medicines for conditions, including cancer, organ transplantation, and autoimmune conditions. Hepatitis C  Blood testing is recommended for:  Everyone born from [birth year] through 1965.  Anyone with known risk factors for hepatitis C. Sexually transmitted infections (STIs)  You should be screened for sexually transmitted infections (STIs) including gonorrhea and chlamydia if:  You are sexually active and are younger than 53 years of age.  You are older than 53 years of age and your health care provider tells you that you are at risk for this type of infection.  Your sexual activity has changed since you were last screened and you are at an increased risk for chlamydia or gonorrhea. Ask your health care provider if you are at risk.  If you do not have HIV, but are at risk, it may be recommended that you take a prescription medicine daily to prevent HIV infection. This is called pre-exposure prophylaxis (PrEP). You are considered at risk if:  You are sexually active and do not regularly use condoms or know the HIV status  of your partner(s).  You take drugs by injection.  You are sexually active with a partner who has HIV. Talk with your health care provider about whether you are at high risk of being infected with HIV. If you choose to begin PrEP, you should first be tested for HIV. You should then be tested every 3 months for as long as you  are taking PrEP.  PREGNANCY   If you are premenopausal and you may become pregnant, ask your health care provider about preconception counseling.  If you may become pregnant, take 400 to 800 micrograms (mcg) of folic acid every day.  If you want to prevent pregnancy, talk to your health care provider about birth control (contraception). OSTEOPOROSIS AND MENOPAUSE   Osteoporosis is a disease in which the bones lose minerals and strength with aging. This can result in serious bone fractures. Your risk for osteoporosis can be identified using a bone density scan.  If you are 74 years of age or older, or if you are at risk for osteoporosis and fractures, ask your health care provider if you should be screened.  Ask your health care provider whether you should take a calcium or vitamin D supplement to lower your risk for osteoporosis.  Menopause may have certain physical symptoms and risks.  Hormone replacement therapy may reduce some of these symptoms and risks. Talk to your health care provider about whether hormone replacement therapy is right for you.  HOME CARE INSTRUCTIONS   Schedule regular health, dental, and eye exams.  Stay current with your immunizations.   Do not use any tobacco products including cigarettes, chewing tobacco, or electronic cigarettes.  If you are pregnant, do not drink alcohol.  If you are breastfeeding, limit how much and how often you drink alcohol.  Limit alcohol intake to no more than 1 drink per day for nonpregnant women. One drink equals 12 ounces of beer, 5 ounces of wine, or 1 ounces of hard liquor.  Do not use street  drugs.  Do not share needles.  Ask your health care provider for help if you need support or information about quitting drugs.  Tell your health care provider if you often feel depressed.  Tell your health care provider if you have ever been abused or do not feel safe at home. Document Released: 07/11/2010 Document Revised: 05/12/2013 Document Reviewed: 11/27/2012 [facility] Patient Information 2015 [place], [place]. This information is not intended to replace advice given to you by your health care provider. Make sure you discuss any questions you have with your health care provider.

## 2013-11-20 NOTE — Assessment & Plan Note (Signed)
Refill Atrovent, continue Claritin.

## 2013-12-09 ENCOUNTER — Ambulatory Visit: Payer: BC Managed Care – PPO | Admitting: Internal Medicine

## 2014-02-09 ENCOUNTER — Ambulatory Visit: Payer: BC Managed Care – PPO | Admitting: Internal Medicine

## 2014-06-15 NOTE — H&P (Signed)
Erin Stokes, Erin Stokes               ACCOUNT NO.:  [id number]  MEDICAL RECORD NO.:  [id number]  LOCATION:                                 FACILITY:  PHYSICIAN:  Darlyn Chamber, M.D.   DATE OF BIRTH:  1960/03/20  DATE OF ADMISSION:  06/24/2014 DATE OF DISCHARGE:                             HISTORY & PHYSICAL   Date of her surgery is June 24, 2014, at [facility] area in [place]:  The patient is a 54 year old gravida 2, para 2 postmenopausal patient presents for hysteroscopy due to the finding of an endometrial polyp.  She also had a benign 3.3 cm cyst of the right ovary.  She had experienced some postmenopausal bleeding and underwent a saline infusion ultrasound on May 18, 2014, finding the polyp and she now presents for hysteroscopy.  ALLERGIES:  She is allergic to CELEBREX.  MEDICATIONS:  She is on Claritin, Atrovent, and Advil.  PAST MEDICAL HISTORY:  Usual childhood diseases.  No significant sequelae.  Does have a history of skin cancer.  SURGICAL HISTORY:  She has had a previous hymenectomy.  She has had 2 vaginal deliveries.  SOCIAL HISTORY:  No tobacco, occasional alcohol use.  FAMILY HISTORY:  Noncontributory.  REVIEW OF SYSTEMS:  Noncontributory.  PHYSICAL EXAMINATION:  VITAL SIGNS:  The patient is afebrile with stable vital signs. HEENT:  The patient is normocephalic.  Pupils equal, round, reactive to light and accommodation.  Extraocular movements were intact.  Sclerae and conjunctivae are clear. Oropharynx clear. NECK:  Without thyromegaly. BREASTS:  Not examined. LUNGS:  Clear. CARDIOVASCULAR SYSTEM:  Regular rhythm and rate without murmurs or gallops.  There were no carotid or abdominal bruits. ABDOMEN:  Benign.  No mass, organomegaly, or tenderness. PELVIC:  Normal external genitalia.  Vaginal mucosa is clear.  Cervix unremarkable.  Uterus normal size, shape, and contour.  Adnexa free of mass or  tenderness. EXTREMITIES:  Trace edema. NEUROLOGIC:  Grossly within normal limits.  IMPRESSION:  Postmenopausal bleeding with endometrial polyp.  PLAN:  The patient will have hysteroscopic evaluation, MyoSure resection of endometrial polyp.  She is also being with a dilation and curettage. Risks of procedure have been discussed including the risk of infection. The risk of vascular injury, this could lead to hemorrhage requiring transfusion with the risk of AIDS or hepatitis.  Excessive bleeding could require hysterectomy.  There is a risk of perforation with injury to adjacent organs such as bowel that could require further exploratory surgery.  Risk of deep venous thrombosis and pulmonary embolus.  The patient expressed understanding of indications and risks.     Darlyn Chamber, M.D.     JSM/MEDQ  D:  06/15/2014  T:  06/15/2014  Job:  [id number]

## 2014-06-19 ENCOUNTER — Encounter (HOSPITAL_BASED_OUTPATIENT_CLINIC_OR_DEPARTMENT_OTHER): Payer: Self-pay | Admitting: *Deleted

## 2014-06-22 ENCOUNTER — Encounter (HOSPITAL_BASED_OUTPATIENT_CLINIC_OR_DEPARTMENT_OTHER): Payer: Self-pay | Admitting: *Deleted

## 2014-06-22 NOTE — Progress Notes (Signed)
Pt instructed npo pmn 6/14 x claritin w sip of water.  Ok to take atrovent.  To [facility] 6/15 @ 0700.  Needs cbc on arrival

## 2014-07-09 ENCOUNTER — Encounter (HOSPITAL_BASED_OUTPATIENT_CLINIC_OR_DEPARTMENT_OTHER): Payer: Self-pay | Admitting: *Deleted

## 2014-07-09 NOTE — Progress Notes (Signed)
NPO AFTER MN.  ARRIVE AT 0600. NEEDS CBC.  WILL TAKE CLARITIN AND DO ATROVENT NASAL SPRAY AM DOS W/ SIPS OF WATER.

## 2014-07-15 ENCOUNTER — Encounter (HOSPITAL_BASED_OUTPATIENT_CLINIC_OR_DEPARTMENT_OTHER): Payer: Self-pay | Admitting: Anesthesiology

## 2014-07-15 NOTE — Anesthesia Preprocedure Evaluation (Addendum)
Anesthesia Evaluation  Patient identified by MRN, date of birth, ID band Patient awake    Reviewed: Allergy & Precautions, NPO status , Patient's Chart, lab work & pertinent test results  Airway Mallampati: II  TM Distance: >3 FB Neck ROM: Full    Dental no notable dental hx.    Pulmonary neg pulmonary ROS,  breath sounds clear to auscultation  Pulmonary exam normal       Cardiovascular negative cardio ROS Normal cardiovascular examRhythm:Regular Rate:Normal     Neuro/Psych negative neurological ROS  negative psych ROS   GI/Hepatic negative GI ROS, Neg liver ROS,   Endo/Other  negative endocrine ROS  Renal/GU negative Renal ROS  negative genitourinary   Musculoskeletal negative musculoskeletal ROS (+)   Abdominal   Peds negative pediatric ROS (+)  Hematology negative hematology ROS (+)   Anesthesia Other Findings   Reproductive/Obstetrics negative OB ROS                             Anesthesia Physical Anesthesia Plan  ASA: I  Anesthesia Plan: MAC   Post-op Pain Management:    Induction: Intravenous  Airway Management Planned: Natural Airway  Additional Equipment:   Intra-op Plan:   Post-operative Plan:   Informed Consent: I have reviewed the patients History and Physical, chart, labs and discussed the procedure including the risks, benefits and alternatives for the proposed anesthesia with the patient or authorized representative who has indicated his/her understanding and acceptance.   Dental advisory given  Plan Discussed with: CRNA  Anesthesia Plan Comments: (Discussed general and MAC and she prefers MAC)       Anesthesia Quick Evaluation

## 2014-07-16 ENCOUNTER — Ambulatory Visit (HOSPITAL_BASED_OUTPATIENT_CLINIC_OR_DEPARTMENT_OTHER): Payer: BLUE CROSS/BLUE SHIELD | Admitting: Anesthesiology

## 2014-07-16 ENCOUNTER — Ambulatory Visit (HOSPITAL_BASED_OUTPATIENT_CLINIC_OR_DEPARTMENT_OTHER)
Admission: RE | Admit: 2014-07-16 | Discharge: 2014-07-16 | Disposition: A | Discharge status: Home / Self Care | Payer: BLUE CROSS/BLUE SHIELD | Source: Ambulatory Visit | Attending: Obstetrics and Gynecology | Admitting: Obstetrics and Gynecology

## 2014-07-16 ENCOUNTER — Encounter (HOSPITAL_BASED_OUTPATIENT_CLINIC_OR_DEPARTMENT_OTHER): Payer: Self-pay | Admitting: Anesthesiology

## 2014-07-16 ENCOUNTER — Encounter (HOSPITAL_BASED_OUTPATIENT_CLINIC_OR_DEPARTMENT_OTHER)
Admission: RE | Disposition: A | Discharge status: Home / Self Care | Payer: Self-pay | Source: Ambulatory Visit | Attending: Obstetrics and Gynecology

## 2014-07-16 DIAGNOSIS — N950 Postmenopausal bleeding: Secondary | ICD-10-CM | POA: Diagnosis not present

## 2014-07-16 DIAGNOSIS — N8320 Unspecified ovarian cysts: Secondary | ICD-10-CM | POA: Insufficient documentation

## 2014-07-16 DIAGNOSIS — N856 Intrauterine synechiae: Secondary | ICD-10-CM | POA: Insufficient documentation

## 2014-07-16 DIAGNOSIS — Z791 Long term (current) use of non-steroidal anti-inflammatories (NSAID): Secondary | ICD-10-CM | POA: Insufficient documentation

## 2014-07-16 DIAGNOSIS — N840 Polyp of corpus uteri: Secondary | ICD-10-CM | POA: Diagnosis present

## 2014-07-16 DIAGNOSIS — Z79899 Other long term (current) drug therapy: Secondary | ICD-10-CM | POA: Diagnosis not present

## 2014-07-16 HISTORY — DX: Polyp of corpus uteri: N84.0

## 2014-07-16 HISTORY — PX: DILATATION & CURETTAGE/HYSTEROSCOPY WITH MYOSURE: SHX6511

## 2014-07-16 HISTORY — DX: Presence of spectacles and contact lenses: Z97.3

## 2014-07-16 HISTORY — DX: Other specified postprocedural states: Z98.890

## 2014-07-16 HISTORY — DX: Personal history of other malignant neoplasm of skin: Z85.828

## 2014-07-16 LAB — CBC
HEMATOCRIT: 41.1 % (ref 36.0–46.0)
Hemoglobin: 13.7 g/dL (ref 12.0–15.0)
MCH: 30.7 pg (ref 26.0–34.0)
MCHC: 33.3 g/dL (ref 30.0–36.0)
MCV: 92.2 fL (ref 78.0–100.0)
Platelets: 189 10*3/uL (ref 150–400)
RBC: 4.46 MIL/uL (ref 3.87–5.11)
RDW: 13 % (ref 11.5–15.5)
WBC: 4.1 10*3/uL (ref 4.0–10.5)

## 2014-07-16 SURGERY — DILATATION & CURETTAGE/HYSTEROSCOPY WITH MYOSURE
Anesthesia: Monitor Anesthesia Care | Site: Uterus

## 2014-07-16 MED ORDER — SODIUM CHLORIDE 0.9 % IR SOLN
Status: DC | PRN
  Administered 2014-07-16: 3000 mL

## 2014-07-16 MED ORDER — FENTANYL CITRATE (PF) 100 MCG/2ML IJ SOLN
INTRAMUSCULAR | Status: DC | PRN
  Administered 2014-07-16: 25 ug via INTRAVENOUS
  Administered 2014-07-16: 12.5 ug via INTRAVENOUS
  Administered 2014-07-16: 25 ug via INTRAVENOUS

## 2014-07-16 MED ORDER — FENTANYL CITRATE (PF) 250 MCG/5ML IJ SOLN: INTRAMUSCULAR | Status: DC | PRN

## 2014-07-16 MED ORDER — ACETAMINOPHEN 10 MG/ML IV SOLN
INTRAVENOUS | Status: DC | PRN
  Administered 2014-07-16: 1000 mg via INTRAVENOUS

## 2014-07-16 MED ORDER — MIDAZOLAM HCL 2 MG/2ML IJ SOLN
INTRAMUSCULAR | Status: AC
  Filled 2014-07-16: qty 2

## 2014-07-16 MED ORDER — CEFAZOLIN SODIUM 1-5 GM-% IV SOLN
INTRAVENOUS | Status: AC
  Filled 2014-07-16: qty 100

## 2014-07-16 MED ORDER — LACTATED RINGERS IV SOLN
INTRAVENOUS | Status: DC
  Administered 2014-07-16: 07:00:00 via INTRAVENOUS
  Filled 2014-07-16: qty 1000

## 2014-07-16 MED ORDER — ONDANSETRON HCL 4 MG/2ML IJ SOLN
INTRAMUSCULAR | Status: DC | PRN
  Administered 2014-07-16: 4 mg via INTRAVENOUS

## 2014-07-16 MED ORDER — DEXAMETHASONE SODIUM PHOSPHATE 4 MG/ML IJ SOLN
INTRAMUSCULAR | Status: DC | PRN
  Administered 2014-07-16: 10 mg via INTRAVENOUS

## 2014-07-16 MED ORDER — PROPOFOL 500 MG/50ML IV EMUL
INTRAVENOUS | Status: DC | PRN
  Administered 2014-07-16: 200 ug/kg/min via INTRAVENOUS

## 2014-07-16 MED ORDER — FENTANYL CITRATE (PF) 100 MCG/2ML IJ SOLN
25.0000 ug | INTRAMUSCULAR | Status: DC | PRN
  Filled 2014-07-16: qty 1

## 2014-07-16 MED ORDER — MIDAZOLAM HCL 5 MG/5ML IJ SOLN
INTRAMUSCULAR | Status: DC | PRN
  Administered 2014-07-16: 2 mg via INTRAVENOUS

## 2014-07-16 MED ORDER — OXYCODONE-ACETAMINOPHEN 7.5-325 MG PO TABS: 1.0000 | ORAL_TABLET | ORAL | Status: DC | PRN

## 2014-07-16 MED ORDER — KETAMINE HCL 10 MG/ML IJ SOLN
INTRAMUSCULAR | Status: AC
  Filled 2014-07-16: qty 1

## 2014-07-16 MED ORDER — SODIUM CHLORIDE 0.9 % IV SOLN
250.0000 mg | INTRAVENOUS | Status: DC | PRN
  Administered 2014-07-16: 10 ug/kg/min via INTRAVENOUS

## 2014-07-16 MED ORDER — CEFAZOLIN SODIUM-DEXTROSE 2-3 GM-% IV SOLR
2.0000 g | INTRAVENOUS | Status: AC
  Administered 2014-07-16: 2 g via INTRAVENOUS
  Filled 2014-07-16: qty 50

## 2014-07-16 MED ORDER — LIDOCAINE HCL (CARDIAC) 20 MG/ML IV SOLN
INTRAVENOUS | Status: DC | PRN
  Administered 2014-07-16: 50 mg via INTRAVENOUS

## 2014-07-16 MED ORDER — LIDOCAINE-EPINEPHRINE 1 %-1:100000 IJ SOLN
INTRAMUSCULAR | Status: DC | PRN
  Administered 2014-07-16: 20 mL

## 2014-07-16 MED ORDER — PROMETHAZINE HCL 25 MG/ML IJ SOLN
6.2500 mg | INTRAMUSCULAR | Status: DC | PRN
  Filled 2014-07-16: qty 1

## 2014-07-16 MED ORDER — FENTANYL CITRATE (PF) 100 MCG/2ML IJ SOLN
INTRAMUSCULAR | Status: AC
Start: 2014-07-16 — End: 2014-07-16
  Filled 2014-07-16: qty 2

## 2014-07-16 SURGICAL SUPPLY — 32 items
CANISTER SUCTION 2500CC (MISCELLANEOUS) ×2 IMPLANT
CATH ROBINSON RED A/P 16FR (CATHETERS) ×2 IMPLANT
CLOTH BEACON ORANGE TIMEOUT ST (SAFETY) ×2 IMPLANT
CORD ACTIVE DISPOSABLE (ELECTRODE)
CORD ELECTRO ACTIVE DISP (ELECTRODE) IMPLANT
COVER BACK TABLE 60X90IN (DRAPES) ×2 IMPLANT
DEVICE MYOSURE CLASSIC (MISCELLANEOUS) IMPLANT
DEVICE MYOSURE LITE (MISCELLANEOUS) ×2 IMPLANT
DRAPE LG THREE QUARTER DISP (DRAPES) ×2 IMPLANT
DRSG TELFA 3X8 NADH (GAUZE/BANDAGES/DRESSINGS) ×2 IMPLANT
ELECT LOOP GYNE PRO 24FR (CUTTING LOOP)
ELECT REM PT RETURN 9FT ADLT (ELECTROSURGICAL) ×2
ELECT VAPORTRODE GRVD BAR (ELECTRODE) IMPLANT
ELECTRODE LOOP GYNE PRO 24FR (CUTTING LOOP) IMPLANT
ELECTRODE REM PT RTRN 9FT ADLT (ELECTROSURGICAL) ×1 IMPLANT
ELECTRODE VAPORCUT 22FR (ELECTROSURGICAL) IMPLANT
GLOVE BIO SURGEON STRL SZ 6.5 (GLOVE) ×2 IMPLANT
GLOVE BIO SURGEON STRL SZ7 (GLOVE) ×6 IMPLANT
GLOVE INDICATOR 6.5 STRL GRN (GLOVE) ×2 IMPLANT
GLOVE INDICATOR 7.0 STRL GRN (GLOVE) ×2 IMPLANT
GOWN STRL REUS W/ TWL LRG LVL3 (GOWN DISPOSABLE) ×2 IMPLANT
GOWN STRL REUS W/TWL LRG LVL3 (GOWN DISPOSABLE) ×2
LEGGING LITHOTOMY PAIR STRL (DRAPES) ×2 IMPLANT
MANIFOLD NEPTUNE II (INSTRUMENTS) IMPLANT
NEEDLE SPNL 18GX3.5 QUINCKE PK (NEEDLE) ×2 IMPLANT
PACK BASIN DAY SURGERY FS (CUSTOM PROCEDURE TRAY) ×2 IMPLANT
PAD OB MATERNITY 4.3X12.25 (PERSONAL CARE ITEMS) ×2 IMPLANT
PAD PREP 24X48 CUFFED NSTRL (MISCELLANEOUS) ×2 IMPLANT
SEAL ROD LENS SCOPE MYOSURE (ABLATOR) ×2 IMPLANT
SYR CONTROL 10ML LL (SYRINGE) ×2 IMPLANT
TOWEL OR 17X24 6PK STRL BLUE (TOWEL DISPOSABLE) ×4 IMPLANT
WATER STERILE IRR 500ML POUR (IV SOLUTION) ×2 IMPLANT

## 2014-07-16 NOTE — H&P (Signed)
  History and physical exam unchanged 

## 2014-07-16 NOTE — Op Note (Signed)
[name], [name]               ACCOUNT NO.:  [id number]  MEDICAL RECORD NO.:  [id number]  LOCATION:                                 FACILITY:  PHYSICIAN:  Darlyn Chamber, M.D.        DATE OF BIRTH:  DATE OF PROCEDURE:  07/16/2014 DATE OF DISCHARGE:                              OPERATIVE REPORT   PREOPERATIVE DIAGNOSIS:  Endometrial polyp.  POSTOPERATIVE DIAGNOSIS:  Intrauterine synechiae.  OPERATIVE PROCEDURE:  Paracervical block.  Cervical dilatation, hysteroscopy.  Endometrial resections and curettings.  SURGEON:  Darlyn Chamber, MD  ANESTHESIA:  Paracervical block and sedation.  BLOOD LOSS:  Minimal.  PACKS AND DRAINS:  None.  INTRAOPERATIVE BLOOD PLACED:  None.  COMPLICATIONS:  None.  INDICATION:  Dictated in history and physical.  PROCEDURE IN DETAIL:  The patient was taken to the OR and placed in supine position.  After satisfactory level of sedation, she was placed in a dorsal lithotomy position using the Allen stirrups.  Perineum and vagina were prepped out with Betadine.  She was then draped as a sterile field.  A speculum was placed in the vaginal vault.  Cervix was grasped with a single-tooth tenaculum.  Uterus sounded to approximately 9 cm. Cervix serially dilated to a size 25 Pratt dilator.  Hysteroscope was introduced.  Intrauterine cavity was distended using saline. Visualization revealed relatively smooth atrophic endometrium.  She had a synechiae or a scarring from the anterior to posterior wall of the uterine fundus that was probably showing up as a polyp.  We brought in the MyoSure, were able to resect the synechiae and we did multiple endometrial samplings.  At the end of this procedure, we felt like we had adequate sampling.  No other abnormalities were noted.  The hysteroscope was removed.  Endometrial curettings were obtained.  Single-tooth tenaculum and speculum were then removed.  The patient taken out of dorsal lithotomy position.   Once alert, transferred to recovery room in good condition.  Sponge, instrument, and needle count was correct by circulating nurse.     Darlyn Chamber, M.D.     JSM/MEDQ  D:  07/16/2014  T:  07/16/2014  Job:  [id number]

## 2014-07-16 NOTE — Brief Op Note (Signed)
07/16/2014  7:49 AM  PATIENT:  Erin Stokes  54 y.o. female  PRE-OPERATIVE DIAGNOSIS:  POLYP  POST-OPERATIVE DIAGNOSIS:  * No post-op diagnosis entered *  PROCEDURE:  Procedure(s): DILATATION & CURETTAGE/HYSTEROSCOPY WITH MYOSURE (N/A)  SURGEON:  Surgeon(s) and Role:    * Arvella Nigh, MD - Primary  PHYSICIAN ASSISTANT:   ASSISTANTS: none   ANESTHESIA:   IV sedation and paracervical block  EBL:     BLOOD ADMINISTERED:none  DRAINS: none   LOCAL MEDICATIONS USED:  XYLOCAINE  and Amount: 20 ml  SPECIMEN:  Source of Specimen:  endometrial resections and currettings  DISPOSITION OF SPECIMEN:  PATHOLOGY  COUNTS:  YES  TOURNIQUET:  * No tourniquets in log *  DICTATION: .Other Dictation: Dictation Number [id number]  PLAN OF CARE: Discharge to home after PACU  PATIENT DISPOSITION:  PACU - hemodynamically stable.   Delay start of Pharmacological VTE agent (>24hrs) due to surgical blood loss or risk of bleeding: not applicable

## 2014-07-16 NOTE — Discharge Instructions (Signed)

## 2014-07-16 NOTE — Transfer of Care (Signed)
Immediate Anesthesia Transfer of Care Note  Patient: Erin Stokes  Procedure(s) Performed: Procedure(s): DILATATION & CURETTAGE/HYSTEROSCOPY WITH MYOSURE (N/A)  Patient Location: PACU  Anesthesia Type:MAC  Level of Consciousness: awake, alert , oriented and patient cooperative  Airway & Oxygen Therapy: Patient Spontanous Breathing and Patient connected to nasal cannula oxygen  Post-op Assessment: Report given to RN and Post -op Vital signs reviewed and stable  Post vital signs: Reviewed and stable  Last Vitals:  Filed Vitals:   07/16/14 0608  BP: 103/63  Pulse: 65  Temp: 36.9 C  Resp: 14    Complications: No apparent anesthesia complications

## 2014-07-16 NOTE — Anesthesia Postprocedure Evaluation (Signed)
  Anesthesia Post-op Note  Patient: Erin Stokes  Procedure(s) Performed: Procedure(s) (LRB): DILATATION & CURETTAGE/HYSTEROSCOPY WITH MYOSURE (N/A)  Patient Location: PACU  Anesthesia Type: MAC  Level of Consciousness: awake and alert   Airway and Oxygen Therapy: Patient Spontanous Breathing  Post-op Pain: mild  Post-op Assessment: Post-op Vital signs reviewed, Patient's Cardiovascular Status Stable, Respiratory Function Stable, Patent Airway and No signs of Nausea or vomiting  Last Vitals:  Filed Vitals:   07/16/14 0843  BP:   Pulse: 59  Temp:   Resp: 11    Post-op Vital Signs: stable   Complications: No apparent anesthesia complications

## 2014-07-16 NOTE — Anesthesia Procedure Notes (Signed)
Procedure Name: MAC Date/Time: 07/16/2014 7:31 AM Performed by: Wanita Chamberlain Pre-anesthesia Checklist: Patient identified, Timeout performed, Emergency Drugs available, Suction available and Patient being monitored Patient Re-evaluated:Patient Re-evaluated prior to inductionOxygen Delivery Method: Nasal cannula Preoxygenation: Pre-oxygenation with 100% oxygen Intubation Type: IV induction Placement Confirmation: positive ETCO2 Dental Injury: Teeth and Oropharynx as per pre-operative assessment

## 2014-07-17 ENCOUNTER — Encounter (HOSPITAL_BASED_OUTPATIENT_CLINIC_OR_DEPARTMENT_OTHER): Payer: Self-pay | Admitting: Obstetrics and Gynecology

## 2014-12-17 ENCOUNTER — Other Ambulatory Visit: Payer: Self-pay | Admitting: Internal Medicine

## 2015-01-18 ENCOUNTER — Other Ambulatory Visit: Payer: Self-pay

## 2015-01-18 MED ORDER — IPRATROPIUM BROMIDE 0.03 % NA SOLN: 1.0000 | Freq: Three times a day (TID) | NASAL | Status: DC

## 2015-01-18 NOTE — Telephone Encounter (Signed)
Pt LMOM rq rf for nasal spray

## 2015-03-05 ENCOUNTER — Encounter: Payer: Self-pay | Admitting: Internal Medicine

## 2015-03-05 ENCOUNTER — Ambulatory Visit (INDEPENDENT_AMBULATORY_CARE_PROVIDER_SITE_OTHER): Payer: BLUE CROSS/BLUE SHIELD | Admitting: Internal Medicine

## 2015-03-05 VITALS — BP 108/62 | HR 73 | Temp 98.6°F | Resp 14 | Ht 65.5 in | Wt 119.1 lb

## 2015-03-05 DIAGNOSIS — H6690 Otitis media, unspecified, unspecified ear: Secondary | ICD-10-CM | POA: Diagnosis not present

## 2015-03-05 NOTE — Progress Notes (Signed)
   Subjective:    Patient ID: Erin Stokes, female    DOB: 05-02-1960, 55 y.o.   MRN: [id number]  HPI The patient is a 55 YO female coming in for left ear pain. She was just treated for ear infection with amoxicillin and is not fully better. Still with intact hearing and denies tinnitus. Some pressure and feels like fluid in the ears. Finished the amoxicillin yesterday and was also given some ear drops which she has not been using well. No fevers or chills.   Review of Systems  Constitutional: Positive for activity change. Negative for fever, chills, appetite change, fatigue and unexpected weight change.  HENT: Positive for ear discharge and ear pain. Negative for congestion, postnasal drip, rhinorrhea, sinus pressure, sore throat and trouble swallowing.   Eyes: Negative.   Respiratory: Negative for cough, chest tightness, shortness of breath and wheezing.   Cardiovascular: Negative for chest pain, palpitations and leg swelling.  Gastrointestinal: Negative.       Objective:   Physical Exam  Constitutional: She appears well-developed and well-nourished.  HENT:  Head: Normocephalic and atraumatic.  Right Ear: External ear normal.  Left ear with inflammation and fluid in the eardrum, no purulent material and no perforation of the TM. Right ear normal, oropharynx normal.   Eyes: EOM are normal.  Neck: Normal range of motion.  Cardiovascular: Normal rate and regular rhythm.   Pulmonary/Chest: Effort normal and breath sounds normal.  Abdominal: Soft. She exhibits no distension. There is no tenderness.   Filed Vitals:   03/05/15 1310  BP: 108/62  Pulse: 73  Temp: 98.6 F (37 C)  TempSrc: Oral  Resp: 14  Height: 5' 5.5" (1.664 m)  Weight: 119 lb 1.9 oz (54.032 kg)  SpO2: 98%      Assessment & Plan:

## 2015-03-05 NOTE — Assessment & Plan Note (Signed)
Finished amoxicillin and did not take her ear drops (looked up for english equivalent and is corticosporin). Advised to take those 3 drops TID for 3 days to help with the residual inflammation.

## 2015-03-05 NOTE — Progress Notes (Signed)
Pre visit review using our clinic review tool, if applicable. No additional management support is needed unless otherwise documented below in the visit note. 

## 2015-03-05 NOTE — Patient Instructions (Signed)
Use the drops as prescribed for 3 more days to help the inflammation in the ear.   It is okay to try a decongestant to see if that helps with the symptoms.   You should be better in 1 week, if not call us for more antibiotics.

## 2015-04-26 ENCOUNTER — Ambulatory Visit: Payer: BLUE CROSS/BLUE SHIELD

## 2015-04-28 ENCOUNTER — Other Ambulatory Visit: Payer: Self-pay | Admitting: Geriatric Medicine

## 2015-04-28 ENCOUNTER — Ambulatory Visit (INDEPENDENT_AMBULATORY_CARE_PROVIDER_SITE_OTHER): Payer: BLUE CROSS/BLUE SHIELD | Admitting: Geriatric Medicine

## 2015-04-28 DIAGNOSIS — Z23 Encounter for immunization: Secondary | ICD-10-CM

## 2015-04-28 MED ORDER — IPRATROPIUM BROMIDE 0.03 % NA SOLN: 1.0000 | Freq: Three times a day (TID) | NASAL | Status: DC

## 2015-05-19 DIAGNOSIS — L84 Corns and callosities: Secondary | ICD-10-CM | POA: Diagnosis not present

## 2015-05-19 DIAGNOSIS — D2262 Melanocytic nevi of left upper limb, including shoulder: Secondary | ICD-10-CM | POA: Diagnosis not present

## 2015-05-19 DIAGNOSIS — D2261 Melanocytic nevi of right upper limb, including shoulder: Secondary | ICD-10-CM | POA: Diagnosis not present

## 2015-05-19 DIAGNOSIS — Z85828 Personal history of other malignant neoplasm of skin: Secondary | ICD-10-CM | POA: Diagnosis not present

## 2015-06-04 DIAGNOSIS — H1131 Conjunctival hemorrhage, right eye: Secondary | ICD-10-CM | POA: Diagnosis not present

## 2015-06-22 ENCOUNTER — Telehealth: Payer: Self-pay

## 2015-06-22 NOTE — Telephone Encounter (Signed)
Eye is continuing to have burst blood vessels----does she need office visit with you or can you recommend eye doctor for her to see---please advise, we need to call patient back at [phone]

## 2015-06-22 NOTE — Telephone Encounter (Signed)
She can go to any eye doctor she wishes. If she wants [place] to look at her eyes then we would need to see her.

## 2015-06-23 ENCOUNTER — Ambulatory Visit: Payer: BLUE CROSS/BLUE SHIELD | Admitting: Family

## 2015-06-23 NOTE — Telephone Encounter (Signed)
Patient advised, she is going to eye doctor

## 2015-07-09 DIAGNOSIS — Z9889 Other specified postprocedural states: Secondary | ICD-10-CM | POA: Diagnosis not present

## 2015-07-09 DIAGNOSIS — Z85828 Personal history of other malignant neoplasm of skin: Secondary | ICD-10-CM | POA: Diagnosis not present

## 2015-07-09 DIAGNOSIS — C44311 Basal cell carcinoma of skin of nose: Secondary | ICD-10-CM | POA: Diagnosis not present

## 2015-08-16 DIAGNOSIS — H1131 Conjunctival hemorrhage, right eye: Secondary | ICD-10-CM | POA: Diagnosis not present

## 2015-08-30 DIAGNOSIS — M778 Other enthesopathies, not elsewhere classified: Secondary | ICD-10-CM | POA: Diagnosis not present

## 2015-08-30 DIAGNOSIS — M654 Radial styloid tenosynovitis [de Quervain]: Secondary | ICD-10-CM | POA: Diagnosis not present

## 2015-08-30 DIAGNOSIS — M25531 Pain in right wrist: Secondary | ICD-10-CM | POA: Diagnosis not present

## 2015-10-05 DIAGNOSIS — M778 Other enthesopathies, not elsewhere classified: Secondary | ICD-10-CM | POA: Diagnosis not present

## 2015-10-05 DIAGNOSIS — M25531 Pain in right wrist: Secondary | ICD-10-CM | POA: Diagnosis not present

## 2015-10-05 DIAGNOSIS — M654 Radial styloid tenosynovitis [de Quervain]: Secondary | ICD-10-CM | POA: Diagnosis not present

## 2015-10-11 DIAGNOSIS — M654 Radial styloid tenosynovitis [de Quervain]: Secondary | ICD-10-CM | POA: Diagnosis not present

## 2015-10-14 DIAGNOSIS — M654 Radial styloid tenosynovitis [de Quervain]: Secondary | ICD-10-CM | POA: Diagnosis not present

## 2015-10-18 DIAGNOSIS — M654 Radial styloid tenosynovitis [de Quervain]: Secondary | ICD-10-CM | POA: Diagnosis not present

## 2015-10-21 DIAGNOSIS — M654 Radial styloid tenosynovitis [de Quervain]: Secondary | ICD-10-CM | POA: Diagnosis not present

## 2015-10-25 DIAGNOSIS — M654 Radial styloid tenosynovitis [de Quervain]: Secondary | ICD-10-CM | POA: Diagnosis not present

## 2015-11-03 DIAGNOSIS — M25531 Pain in right wrist: Secondary | ICD-10-CM | POA: Diagnosis not present

## 2015-11-03 DIAGNOSIS — M654 Radial styloid tenosynovitis [de Quervain]: Secondary | ICD-10-CM | POA: Diagnosis not present

## 2015-11-03 DIAGNOSIS — M778 Other enthesopathies, not elsewhere classified: Secondary | ICD-10-CM | POA: Diagnosis not present

## 2016-01-13 ENCOUNTER — Ambulatory Visit (INDEPENDENT_AMBULATORY_CARE_PROVIDER_SITE_OTHER): Payer: BLUE CROSS/BLUE SHIELD | Admitting: Internal Medicine

## 2016-01-13 ENCOUNTER — Encounter: Payer: Self-pay | Admitting: Internal Medicine

## 2016-01-13 DIAGNOSIS — J069 Acute upper respiratory infection, unspecified: Secondary | ICD-10-CM | POA: Insufficient documentation

## 2016-01-13 DIAGNOSIS — B9789 Other viral agents as the cause of diseases classified elsewhere: Secondary | ICD-10-CM

## 2016-01-13 MED ORDER — BENZONATATE 200 MG PO CAPS: 200.0000 mg | ORAL_CAPSULE | Freq: Two times a day (BID) | ORAL | 0 refills | Status: DC | PRN

## 2016-01-13 NOTE — Progress Notes (Signed)
   Subjective:    Patient ID: Erin Stokes, female    DOB: 10/11/1960, 56 y.o.   MRN: [id number]  HPI The patient is a 56 YO female coming in for some cough and sinus symptoms. Started on Monday this week with some sinus pressure. Then she got some drainage in the nose and throat. She is coughing with some production. No SOB but when exercising she got winded faster. No fevers but mild chills yesterday. Some sick contacts. Has not tried anything for it except some hot teas which seem to help with the sinus pressure and ibuprofen for the sinus pain which is effective. Some soreness with coughing.   Review of Systems  Constitutional: Positive for chills. Negative for activity change, appetite change, fatigue, fever and unexpected weight change.  HENT: Positive for congestion, postnasal drip, rhinorrhea and sinus pressure. Negative for ear discharge, ear pain, sinus pain, sore throat and trouble swallowing.   Eyes: Negative.   Respiratory: Positive for cough. Negative for chest tightness, shortness of breath and wheezing.   Cardiovascular: Negative for chest pain, palpitations and leg swelling.  Gastrointestinal: Negative.   Musculoskeletal: Negative.   Skin: Negative.       Objective:   Physical Exam  Constitutional: She is oriented to person, place, and time. She appears well-developed and well-nourished. No distress.  HENT:  Head: Normocephalic and atraumatic.  Mild sinus pressure, oropharynx with redness and clear drainage, no nasal crusting. TMs normal bilaterally.   Eyes: EOM are normal.  Neck: Normal range of motion.  Cardiovascular: Normal rate and regular rhythm.   Pulmonary/Chest: Effort normal and breath sounds normal. No respiratory distress. She has no wheezes. She has no rales.  Abdominal: Soft. She exhibits no distension. There is no tenderness. There is no rebound.  Lymphadenopathy:    She has no cervical adenopathy.  Neurological: She is alert and oriented to person,  place, and time.  Skin: Skin is warm and dry.   Vitals:   01/13/16 0949  BP: 110/70  Pulse: 80  Resp: 12  Temp: 98.4 F (36.9 C)  TempSrc: Oral  SpO2: 98%  Weight: 120 lb (54.4 kg)  Height: 5\' 5"  (1.651 m)      Assessment & Plan:

## 2016-01-13 NOTE — Progress Notes (Signed)
Pre visit review using our clinic review tool, if applicable. No additional management support is needed unless otherwise documented below in the visit note. 

## 2016-01-13 NOTE — Assessment & Plan Note (Signed)
She can continue ibuprofen and warm liquids. Taking claritin already. Rx for tessalon perles to help with cough.

## 2016-01-13 NOTE — Patient Instructions (Signed)
We have sent in the tessalon perles which you can take up to 3 times per day to help with the coughing.   You are doing the right stuff so keep up with what you are doing.   This is likely a virus and will last 7-10 days although the cough can take another 1-2 weeks to go away entirely.   If you are not starting to feel better by Tuesday give [place] a call back.

## 2016-01-20 DIAGNOSIS — Z01419 Encounter for gynecological examination (general) (routine) without abnormal findings: Secondary | ICD-10-CM | POA: Diagnosis not present

## 2016-01-20 DIAGNOSIS — Z681 Body mass index (BMI) 19 or less, adult: Secondary | ICD-10-CM | POA: Diagnosis not present

## 2016-01-20 DIAGNOSIS — Z13820 Encounter for screening for osteoporosis: Secondary | ICD-10-CM | POA: Diagnosis not present

## 2016-01-31 ENCOUNTER — Other Ambulatory Visit: Payer: Self-pay | Admitting: Obstetrics and Gynecology

## 2016-01-31 DIAGNOSIS — Z1231 Encounter for screening mammogram for malignant neoplasm of breast: Secondary | ICD-10-CM

## 2016-02-02 DIAGNOSIS — H52203 Unspecified astigmatism, bilateral: Secondary | ICD-10-CM | POA: Diagnosis not present

## 2016-02-02 DIAGNOSIS — H04123 Dry eye syndrome of bilateral lacrimal glands: Secondary | ICD-10-CM | POA: Diagnosis not present

## 2016-02-02 DIAGNOSIS — H524 Presbyopia: Secondary | ICD-10-CM | POA: Diagnosis not present

## 2016-02-02 DIAGNOSIS — H0011 Chalazion right upper eyelid: Secondary | ICD-10-CM | POA: Diagnosis not present

## 2016-02-23 ENCOUNTER — Ambulatory Visit
Admission: RE | Admit: 2016-02-23 | Discharge: 2016-02-23 | Disposition: A | Discharge status: Home / Self Care | Payer: BLUE CROSS/BLUE SHIELD | Source: Ambulatory Visit | Attending: Obstetrics and Gynecology | Admitting: Obstetrics and Gynecology

## 2016-02-23 DIAGNOSIS — Z1231 Encounter for screening mammogram for malignant neoplasm of breast: Secondary | ICD-10-CM

## 2016-03-17 DIAGNOSIS — D692 Other nonthrombocytopenic purpura: Secondary | ICD-10-CM | POA: Diagnosis not present

## 2016-03-17 DIAGNOSIS — L819 Disorder of pigmentation, unspecified: Secondary | ICD-10-CM | POA: Diagnosis not present

## 2016-03-17 DIAGNOSIS — Z85828 Personal history of other malignant neoplasm of skin: Secondary | ICD-10-CM | POA: Diagnosis not present

## 2016-03-30 DIAGNOSIS — M654 Radial styloid tenosynovitis [de Quervain]: Secondary | ICD-10-CM | POA: Diagnosis not present

## 2016-03-30 DIAGNOSIS — M62531 Muscle wasting and atrophy, not elsewhere classified, right forearm: Secondary | ICD-10-CM | POA: Diagnosis not present

## 2016-05-16 ENCOUNTER — Other Ambulatory Visit: Payer: Self-pay | Admitting: Internal Medicine

## 2016-06-22 ENCOUNTER — Ambulatory Visit (INDEPENDENT_AMBULATORY_CARE_PROVIDER_SITE_OTHER): Payer: BLUE CROSS/BLUE SHIELD | Admitting: Internal Medicine

## 2016-06-22 ENCOUNTER — Encounter: Payer: Self-pay | Admitting: Internal Medicine

## 2016-06-22 VITALS — BP 126/84 | HR 73 | Temp 98.0°F | Resp 16 | Wt 123.0 lb

## 2016-06-22 DIAGNOSIS — R233 Spontaneous ecchymoses: Secondary | ICD-10-CM

## 2016-06-22 DIAGNOSIS — R238 Other skin changes: Secondary | ICD-10-CM | POA: Diagnosis not present

## 2016-06-22 DIAGNOSIS — M62531 Muscle wasting and atrophy, not elsewhere classified, right forearm: Secondary | ICD-10-CM | POA: Insufficient documentation

## 2016-06-22 NOTE — Progress Notes (Signed)
Subjective:    Patient ID: Erin Stokes, female    DOB: 1960-06-18, 56 y.o.   MRN: [id number]  HPI She is here for an acute visit.   She had tendinitis in the right wrist last fall.  She tried naprosyn.  It did not go away.  She had a steroid injection at [facility].  The tendinitis went away.  About three months later she was getting prominent veins in her right wrist.  She started bruising easily.  If she knocks into her anything wrist region she gets bruising.  More recently her skin will break if knocks into something.  Her skin there is also hypersensitive.    She saw derm recently and they advised her to see orthopedics.  She saw orthopedics - a Different orthopedic then improved to the injection. He advised that he had never seen anything quite like that. He advised her that this can hent deteriorated and the soft tissues head atrophy. He advised never to get a steroid injection again. He had no other advice.  She wonders if anything else can be done. She wonders what she can do to protect the arm. She is very active and still works out less weights. She does not notice any weakness in the arm or hand. She denies any numbness or tingling in the arm or hand.     Medications and allergies reviewed with patient and updated if appropriate.  Patient Active Problem List   Diagnosis Date Noted  . Viral URI with cough 01/13/2016  . Endometrial polyp 07/16/2014    Class: Present on Admission  . Routine general medical examination at a health care facility 11/20/2013  . Allergic rhinitis 05/15/2007    Current Outpatient Prescriptions on File Prior to Visit  Medication Sig Dispense Refill  . Ascorbic Acid (VITAMIN C PO) Take 500 mg by mouth daily.    Marland Kitchen CALCIUM PO Take by mouth. With magnesium    . Cholecalciferol (VITAMIN D-3 PO) Take 1,000 Units by mouth daily.    Marland Kitchen ibuprofen (ADVIL,MOTRIN) 100 MG tablet Take 100 mg by mouth every 6 (six) hours as needed.      Marland Kitchen  ipratropium (ATROVENT) 0.03 % nasal spray USE 1 SPRAY IN EACH NOSTRIL THREE TIMES DAILY 30 mL 0  . loratadine (CLARITIN) 10 MG tablet Take 10 mg by mouth daily.      Marland Kitchen MELATONIN PO Take 2 mg by mouth as needed.     . Multiple Vitamins-Minerals (MULTIVITAMIN PO) Take by mouth daily.     No current facility-administered medications on file prior to visit.     Past Medical History:  Diagnosis Date  . Allergic rhinitis, cause unspecified   . Endometrial polyp   . Family history of ischemic heart disease   . History of basal cell carcinoma excision    nose 2013  . Irregular menstrual cycle   . Wears glasses     Past Surgical History:  Procedure Laterality Date  . basal cell cancer     removed from nose in October 2013  . DENTAL SURGERY     implants x 2  . DILATATION & CURETTAGE/HYSTEROSCOPY WITH MYOSURE N/A 07/16/2014   Procedure: DILATATION & CURETTAGE/HYSTEROSCOPY WITH MYOSURE;  Surgeon: Arvella Nigh, MD;  Location: [facility];  Service: Gynecology;  Laterality: N/A;  . HYMENECTOMY     as a teenager    Social History   Social History  . Marital status: Legally Separated    Spouse name:  N/A  . Number of children: N/A  . Years of education: N/A   Social History Main Topics  . Smoking status: Never Smoker  . Smokeless tobacco: Never Used  . Alcohol use 8.4 oz/week    14 Glasses of wine per week     Comment: 2 drinks per day  . Drug use: No  . Sexual activity: Not on file   Other Topics Concern  . Not on file   Social History Narrative  . No narrative on file    Family History  Problem Relation Age of Onset  . Colon cancer Neg Hx   . Esophageal cancer Neg Hx   . Rectal cancer Neg Hx   . Stomach cancer Neg Hx     Review of Systems  Constitutional: Negative for fever.  Musculoskeletal:       Changes in the muscle and vasculature of her right distal arm  Skin: Positive for color change. Negative for wound.  Neurological: Negative for weakness and  numbness.  Hematological: Bruises/bleeds easily.       Objective:   Vitals:   06/22/16 1041  BP: 126/84  Pulse: 73  Resp: 16  Temp: 98 F (36.7 C)   Filed Weights   06/22/16 1041  Weight: 123 lb (55.8 kg)   Body mass index is 20.47 kg/m.  Wt Readings from Last 3 Encounters:  06/22/16 123 lb (55.8 kg)  01/13/16 120 lb (54.4 kg)  03/05/15 119 lb 1.9 oz (54 kg)     Physical Exam  Constitutional: She appears well-developed and well-nourished. No distress.  Cardiovascular:  Normal radial and ulnar pulse right wrist  Musculoskeletal:  Mild muscular atrophy distal right forearm, no thenar process atrophy or hand atrophy  Neurological:  Normal strength and sensation right upper extremity  Skin: She is not diaphoretic.  Several bruises anterior distal right forearm          Assessment & Plan:   See Problem List for Assessment and Plan of chronic medical problems.

## 2016-06-22 NOTE — Patient Instructions (Addendum)
Set up an appointment with Dr Tamala Julian

## 2016-06-22 NOTE — Assessment & Plan Note (Signed)
Related to steroid injection in September 2017-orthopedic available she has seen dermatology and orthopedics recently discussed case with dr. Tamala Julian and he will see her to evaluate this further to make sure nothing else is going on that could be causing some of her symptoms

## 2016-07-18 NOTE — Progress Notes (Signed)
[facility] [address] [place], [place] [address] Phone: [phone] Subjective:    I'm seeing this patient by the request  of:  Hoyt Koch, MD   CC: right arm pain  [id number]  Erin Stokes is a 56 y.o. female coming in with complaint of Right arm and wrist pain. Patient did have a fall. Patient was seen by another provider. Patient at that time was given an injection that didn't seem to help intermittently. Patient States 10 this done and didn't initially have pain in both indentation. Unfortunate since then has started having significant decrease in the musculature around the area with increasing venous outflow she states. Patient states that it is not uncomfortable but it does feel different. Patient states it is having atrophy also of the skin. Patient is concerned because it seems to be worsening and not improving. Denies though any significant weakness denies any constant numbness. Does work at her computer.     Past Medical History:  Diagnosis Date  . Allergic rhinitis, cause unspecified   . Endometrial polyp   . Family history of ischemic heart disease   . History of basal cell carcinoma excision    nose 2013  . Irregular menstrual cycle   . Wears glasses    Past Surgical History:  Procedure Laterality Date  . basal cell cancer     removed from nose in October 2013  . DENTAL SURGERY     implants x 2  . DILATATION & CURETTAGE/HYSTEROSCOPY WITH MYOSURE N/A 07/16/2014   Procedure: DILATATION & CURETTAGE/HYSTEROSCOPY WITH MYOSURE;  Surgeon: Arvella Nigh, MD;  Location: [facility];  Service: Gynecology;  Laterality: N/A;  . HYMENECTOMY     as a teenager   Social History   Social History  . Marital status: Legally Separated    Spouse name: N/A  . Number of children: N/A  . Years of education: N/A   Social History Main Topics  . Smoking status: Never Smoker  . Smokeless tobacco: Never Used  . Alcohol use  8.4 oz/week    14 Glasses of wine per week     Comment: 2 drinks per day  . Drug use: No  . Sexual activity: Not Asked   Other Topics Concern  . None   Social History Narrative  . None   Allergies  Allergen Reactions  . Cortisone     Cortisone inj caused atrophy, skin deterioration  . Celebrex [Celecoxib] Rash    Able to take advil ,ibuprofen without problems   Family History  Problem Relation Age of Onset  . Colon cancer Neg Hx   . Esophageal cancer Neg Hx   . Rectal cancer Neg Hx   . Stomach cancer Neg Hx     Past medical history, social, surgical and family history all reviewed in electronic medical record.  No pertanent information unless stated regarding to the chief complaint.   Review of Systems:Review of systems updated and as accurate as of 07/19/16  No headache, visual changes, nausea, vomiting, diarrhea, constipation, dizziness, abdominal pain, skin rash, fevers, chills, night sweats, weight loss, swollen lymph nodes, body aches, joint swelling, muscle aches, chest pain, shortness of breath, mood changes.   Objective  Blood pressure 110/74, pulse (!) 59, height 5\' 6"  (1.676 m), weight 121 lb (54.9 kg), last menstrual period 08/06/2012, SpO2 99 %. Systems examined below as of 07/19/16   General: No apparent distress alert and oriented x3 mood and affect normal, dressed  appropriately.  HEENT: Pupils equal, extraocular movements intact  Respiratory: Patient's speak in full sentences and does not appear short of breath  Cardiovascular: No lower extremity edema, non tender, no erythema  Skin: Warm dry intact with no signs of infection or rash on extremities or on axial skeleton.  Abdomen: Soft nontender  Neuro: Cranial nerves II through XII are intact, neurovascularly intact in all extremities with 2+ DTRs and 2+ pulses.  Lymph: No lymphadenopathy of posterior or anterior cervical chain or axillae bilaterally.  Gait normal with good balance and coordination.  MSK:   Non tender with full range of motion and good stability and symmetric strength and tone of shoulders, elbows, hip, knee and ankles bilaterally.  Wrist: Right Inspection severe atrophy of the musculature on the radial aspect. Bruising noted. Patient is even have atrophy of the skin noted. ROM smooth and normal with good flexion and extension and ulnar/radial deviation that is symmetrical with opposite wrist. No significant pain in the area. No snuffbox tenderness. No tenderness over Canal of [place]. Strength 5/5 in all directions without pain. Negative Finkelstein, tinel's and phalens. Negative Watson's test. Contralateral wrist unremarkable  MSK US performed of: Right wrist This study was ordered, performed, and interpreted by Charlann Boxer D.O.  Wrist: Significant atrophy noted of the flexor carpi radialis muscle. All tendons seem to be intact. No significant signs of any type of infectious etiology. Mild increase in Doppler flow in the area. No masses appreciated.  IMPRESSION:  Atrophy of the forearm musculature with no nerve or vascular compromise..       Impression and Recommendations:     This case required medical decision making of moderate complexity.      Note: This dictation was prepared with Dragon dictation along with smaller phrase technology. Any transcriptional errors that result from this process are unintentional.

## 2016-07-19 ENCOUNTER — Encounter: Payer: Self-pay | Admitting: Family Medicine

## 2016-07-19 ENCOUNTER — Ambulatory Visit (INDEPENDENT_AMBULATORY_CARE_PROVIDER_SITE_OTHER): Payer: BLUE CROSS/BLUE SHIELD | Admitting: Family Medicine

## 2016-07-19 ENCOUNTER — Ambulatory Visit: Payer: Self-pay

## 2016-07-19 VITALS — BP 110/74 | HR 59 | Ht 66.0 in | Wt 121.0 lb

## 2016-07-19 DIAGNOSIS — M62531 Muscle wasting and atrophy, not elsewhere classified, right forearm: Secondary | ICD-10-CM | POA: Diagnosis not present

## 2016-07-19 DIAGNOSIS — M25531 Pain in right wrist: Secondary | ICD-10-CM

## 2016-07-19 MED ORDER — VITAMIN D (ERGOCALCIFEROL) 1.25 MG (50000 UNIT) PO CAPS: 50000.0000 [IU] | ORAL_CAPSULE | ORAL | 0 refills | Status: DC

## 2016-07-19 NOTE — Assessment & Plan Note (Signed)
Patient does have significant atrophy of the right forearm. This is likely secondary to the steroid injection. Atrophy of the skin is noted as well. We discussed with patient that likelihood is that this will come back over the course of time but it may take another several months. We discussed the importance of vitamin D, B12 and B6 supplementation. We discussed protein supplementations as well. Encourage her to do some padding to avoid any type of direct impact in the area with her having less padding in the area for the nerve.

## 2016-07-19 NOTE — Patient Instructions (Addendum)
Good to see you.  Consider padding to avoid direct impact  Once weekly vitamin D for 12 weeks  B12 1063mcg daily  B6 200mg  daily  Aqua derm or eucerin 2 times daily for the skin.  If the nerves get more irritated we can check it out sooner Send a message in 3 weeks to give me an update.  Otherwise see me again in 6-8 weeks .

## 2016-08-09 ENCOUNTER — Other Ambulatory Visit: Payer: Self-pay | Admitting: Internal Medicine

## 2016-08-09 DIAGNOSIS — D2261 Melanocytic nevi of right upper limb, including shoulder: Secondary | ICD-10-CM | POA: Diagnosis not present

## 2016-08-09 DIAGNOSIS — D1801 Hemangioma of skin and subcutaneous tissue: Secondary | ICD-10-CM | POA: Diagnosis not present

## 2016-08-09 DIAGNOSIS — Z85828 Personal history of other malignant neoplasm of skin: Secondary | ICD-10-CM | POA: Diagnosis not present

## 2016-08-09 DIAGNOSIS — L814 Other melanin hyperpigmentation: Secondary | ICD-10-CM | POA: Diagnosis not present

## 2016-08-11 ENCOUNTER — Other Ambulatory Visit: Payer: Self-pay | Admitting: Internal Medicine

## 2016-09-06 ENCOUNTER — Ambulatory Visit (INDEPENDENT_AMBULATORY_CARE_PROVIDER_SITE_OTHER): Payer: BLUE CROSS/BLUE SHIELD | Admitting: Family Medicine

## 2016-09-06 ENCOUNTER — Encounter: Payer: Self-pay | Admitting: Family Medicine

## 2016-09-06 DIAGNOSIS — M62531 Muscle wasting and atrophy, not elsewhere classified, right forearm: Secondary | ICD-10-CM

## 2016-09-06 MED ORDER — VITAMIN D (ERGOCALCIFEROL) 1.25 MG (50000 UNIT) PO CAPS: 50000.0000 [IU] | ORAL_CAPSULE | ORAL | 1 refills | Status: DC

## 2016-09-06 NOTE — Progress Notes (Signed)
[name] [address] [place], [place] [address] Phone: [phone] Subjective:     CC: Right forearm atrophy follow-up  [id number]  Erin Stokes is a 56 y.o. female coming in with complaint of right forearm pain and weakness. Patient was seen and did have significant atrophy of the forearm could've been secondary to a corticosteroid use. Patient was to try conservative therapy including weekly vitamin D, over-the-counter be supplementations, as well as some home exercises some protein supplementations. Due to patient having no overlying fat pad and patient was also to padding. Patient states She has a scan any easy bruising is significantly better. Patient though states that the pain is coming back a little bit. Patient describes the pain as a dull, throbbing aching pain. Has been doing more increasing activity. Working on a more regular basis. Denies any numbness     Past Medical History:  Diagnosis Date  . Allergic rhinitis, cause unspecified   . Endometrial polyp   . Family history of ischemic heart disease   . History of basal cell carcinoma excision    nose 2013  . Irregular menstrual cycle   . Wears glasses    Past Surgical History:  Procedure Laterality Date  . basal cell cancer     removed from nose in October 2013  . DENTAL SURGERY     implants x 2  . DILATATION & CURETTAGE/HYSTEROSCOPY WITH MYOSURE N/A 07/16/2014   Procedure: DILATATION & CURETTAGE/HYSTEROSCOPY WITH MYOSURE;  Surgeon: Arvella Nigh, MD;  Location: [facility];  Service: Gynecology;  Laterality: N/A;  . HYMENECTOMY     as a teenager   Social History   Social History  . Marital status: Legally Separated    Spouse name: N/A  . Number of children: N/A  . Years of education: N/A   Social History Main Topics  . Smoking status: Never Smoker  . Smokeless tobacco: Never Used  . Alcohol use 8.4 oz/week    14 Glasses of wine per week     Comment: 2  drinks per day  . Drug use: No  . Sexual activity: Not Asked   Other Topics Concern  . None   Social History Narrative  . None   Allergies  Allergen Reactions  . Cortisone     Cortisone inj caused atrophy, skin deterioration  . Celebrex [Celecoxib] Rash    Able to take advil ,ibuprofen without problems   Family History  Problem Relation Age of Onset  . Colon cancer Neg Hx   . Esophageal cancer Neg Hx   . Rectal cancer Neg Hx   . Stomach cancer Neg Hx      Past medical history, social, surgical and family history all reviewed in electronic medical record.  No pertanent information unless stated regarding to the chief complaint.   Review of Systems:Review of systems updated and as accurate as of 09/06/16  No headache, visual changes, nausea, vomiting, diarrhea, constipation, dizziness, abdominal pain, skin rash, fevers, chills, night sweats, weight loss, swollen lymph nodes, body aches, joint swelling, muscle aches, chest pain, shortness of breath, mood changes.   Objective  Blood pressure 110/80, pulse 68, height 5\' 6"  (1.676 m), weight 123 lb (55.8 kg), last menstrual period 08/06/2012, SpO2 98 %. Systems examined below as of 09/06/16   General: No apparent distress alert and oriented x3 mood and affect normal, dressed appropriately.  HEENT: Pupils equal, extraocular movements intact  Respiratory: Patient's speak in full sentences and does  not appear short of breath  Cardiovascular: No lower extremity edema, non tender, no erythema  Skin: Warm dry intact with no signs of infection or rash on extremities or on axial skeleton.  Abdomen: Soft nontender  Neuro: Cranial nerves II through XII are intact, neurovascularly intact in all extremities with 2+ DTRs and 2+ pulses.  Lymph: No lymphadenopathy of posterior or anterior cervical chain or axillae bilaterally.  Gait normal with good balance and coordination.  MSK:  Non tender with full range of motion and good stability and  symmetric strength and tone of shoulders, elbows, , hip, knee and ankles bilaterally.  Wrist: Right Inspection still mild atrophy of the musculature noted ROM smooth and normal with good flexion and extension and ulnar/radial deviation that is symmetrical with opposite wrist. Mild pain over the distal radius. No snuffbox tenderness. No tenderness over Canal of Gu[place]. Strength 5/5 in all directions without pain. Negative Finkelstein, tinel's and phalens. Negative Watson's test. Contralateral wrist unremarkable    Impression and Recommendations:     This case required medical decision making of moderate complexity.      Note: This dictation was prepared with Dragon dictation along with smaller phrase technology. Any transcriptional errors that result from this process are unintentional.

## 2016-09-06 NOTE — Patient Instructions (Addendum)
Good to see you  Alvera Singh is your friend.  Aquaderm lotion may help Ok to use pennsaid but follow with lotion.  Turmeric 500mg  1-2 times a day for pain New exercises 3 times a week Start the vitamin D again  Continue the other vitamins See me again in 2-3 months!!

## 2016-09-06 NOTE — Assessment & Plan Note (Signed)
Patient is improving at this time. I do think done once again when she should do well. Continue the vitamin D for another 3 months. Encourage patient to continue the B12 supplementation. We discussed icing regimen. Patient given new exercises. Follow-up again in 2-3 months

## 2016-10-23 ENCOUNTER — Encounter: Payer: Self-pay | Admitting: Family Medicine

## 2016-10-26 ENCOUNTER — Ambulatory Visit: Payer: BLUE CROSS/BLUE SHIELD | Admitting: Family Medicine

## 2016-10-27 ENCOUNTER — Encounter: Payer: Self-pay | Admitting: Family Medicine

## 2016-10-27 ENCOUNTER — Ambulatory Visit (INDEPENDENT_AMBULATORY_CARE_PROVIDER_SITE_OTHER)
Admission: RE | Admit: 2016-10-27 | Discharge: 2016-10-27 | Disposition: A | Discharge status: Home / Self Care | Payer: BLUE CROSS/BLUE SHIELD | Source: Ambulatory Visit | Attending: Family Medicine | Admitting: Family Medicine

## 2016-10-27 ENCOUNTER — Ambulatory Visit: Payer: Self-pay

## 2016-10-27 ENCOUNTER — Ambulatory Visit (INDEPENDENT_AMBULATORY_CARE_PROVIDER_SITE_OTHER): Payer: BLUE CROSS/BLUE SHIELD | Admitting: Family Medicine

## 2016-10-27 VITALS — BP 120/70 | HR 60 | Ht 66.0 in | Wt 123.0 lb

## 2016-10-27 DIAGNOSIS — M25531 Pain in right wrist: Secondary | ICD-10-CM

## 2016-10-27 DIAGNOSIS — M25331 Other instability, right wrist: Secondary | ICD-10-CM | POA: Insufficient documentation

## 2016-10-27 DIAGNOSIS — M62531 Muscle wasting and atrophy, not elsewhere classified, right forearm: Secondary | ICD-10-CM | POA: Diagnosis not present

## 2016-10-27 MED ORDER — NITROGLYCERIN 0.2 MG/HR TD PT24: MEDICATED_PATCH | TRANSDERMAL | 1 refills | Status: DC

## 2016-10-27 NOTE — Assessment & Plan Note (Signed)
Patient's atrophy seems to be improving at this time but I do think that unfortunately patient is now having more of a scaphoid pain. 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 We discussed different treatment options and patient has elected try the vitamin D as nitroglycerin patches to increase the blood flow. We'll make sure there is no other bony normality. We discussed different bracing for the scaphoid lunate ligament. Patient would not be interested any type of surgical interventions. Follow-up again in 4 weeks.

## 2016-10-27 NOTE — Patient Instructions (Signed)
Good to see you  Erin Stokes is your friend.  Nitroglycerin Protocol   Apply 1/4 nitroglycerin patch to affected area daily.  Change position of patch within the affected area every 24 hours.  You may experience a headache during the first 1-2 weeks of using the patch, these should subside.  If you experience headaches after beginning nitroglycerin patch treatment, you may take your preferred over the counter pain reliever.  Another side effect of the nitroglycerin patch is skin irritation or rash related to patch adhesive.  Please notify our office if you develop more severe headaches or rash, and stop the patch.  Tendon healing with nitroglycerin patch may require 12 to 24 weeks depending on the extent of injury.  Men should not use if taking Viagra, Cialis, or Levitra.   Do not use if you have migraines or rosacea.  Continue the vitamin D K2 any dose daily for 4 weeks.  Wear brace with lifting or at least lifting gloves  Xrays downstairs See you soon!

## 2016-10-27 NOTE — Progress Notes (Signed)
[name] [place], [place] [address] Phone: [phone] Subjective:     CC: Right wrist follow-up  [id number]  Erin Stokes is a 56 y.o. female coming in with complaint of Wrist pain. Patient was found to have significant muscle atrophy after a steroid injection for de Quervain's tenosynovitis. Patient was making progress at last follow-up. Feels like she has increased the strength in the wrist but unfortunately is having worsening pain. Seems to be localized and points over the scaphoid bone. Somewhat feels like she is unable to extend her wrist sometimes. Seems to be worse in the morning and then seems to get somewhat better. Patient tries to work out on a regular basis.     Past Medical History:  Diagnosis Date  . Allergic rhinitis, cause unspecified   . Endometrial polyp   . Family history of ischemic heart disease   . History of basal cell carcinoma excision    nose 2013  . Irregular menstrual cycle   . Wears glasses    Past Surgical History:  Procedure Laterality Date  . basal cell cancer     removed from nose in October 2013  . DENTAL SURGERY     implants x 2  . DILATATION & CURETTAGE/HYSTEROSCOPY WITH MYOSURE N/A 07/16/2014   Procedure: DILATATION & CURETTAGE/HYSTEROSCOPY WITH MYOSURE;  Surgeon: Arvella Nigh, MD;  Location: [facility];  Service: Gynecology;  Laterality: N/A;  . HYMENECTOMY     as a teenager   Social History   Social History  . Marital status: Legally Separated    Spouse name: N/A  . Number of children: N/A  . Years of education: N/A   Social History Main Topics  . Smoking status: Never Smoker  . Smokeless tobacco: Never Used  . Alcohol use 8.4 oz/week    14 Glasses of wine per week     Comment: 2 drinks per day  . Drug use: No  . Sexual activity: Not Asked   Other Topics Concern  . None   Social History Narrative  . None   Allergies  Allergen Reactions  . Cortisone    Cortisone inj caused atrophy, skin deterioration  . Celebrex [Celecoxib] Rash    Able to take advil ,ibuprofen without problems   Family History  Problem Relation Age of Onset  . Colon cancer Neg Hx   . Esophageal cancer Neg Hx   . Rectal cancer Neg Hx   . Stomach cancer Neg Hx      Past medical history, social, surgical and family history all reviewed in electronic medical record.  No pertanent information unless stated regarding to the chief complaint.   Review of Systems:Review of systems updated and as accurate as of 10/27/16  No headache, visual changes, nausea, vomiting, diarrhea, constipation, dizziness, abdominal pain, skin rash, fevers, chills, night sweats, weight loss, swollen lymph nodes, body aches, joint swelling,  chest pain, shortness of breath, mood changes. Positive muscle aches  Objective  Blood pressure 120/70, pulse 60, height 5\' 6"  (1.676 m), weight 123 lb (55.8 kg), last menstrual period 08/06/2012, SpO2 98 %. Systems examined below as of 10/27/16   General: No apparent distress alert and oriented x3 mood and affect normal, dressed appropriately.  HEENT: Pupils equal, extraocular movements intact  Respiratory: Patient's speak in full sentences and does not appear short of breath  Cardiovascular: No lower extremity edema, non tender, no erythema  Skin: Warm dry intact with no signs of infection  or rash on extremities or on axial skeleton.  Abdomen: Soft nontender  Neuro: Cranial nerves II through XII are intact, neurovascularly intact in all extremities with 2+ DTRs and 2+ pulses.  Lymph: No lymphadenopathy of posterior or anterior cervical chain or axillae bilaterally.  Gait normal with good balance and coordination.  MSK:  Non tender with full range of motion and good stability and symmetric strength and tone of shoulders, elbows,  hip, knee and ankles bilaterally.  Right wrist shows the patient has significant increase in muscle tone and she's had previously.  More pain over the scaphoid bone and usual. Patient also has what appears to be some mild lunate disassociation with a positive Watson. Good grip strength noted and neurovascular intact distally.  MSK US performed of: Right wrist  This study was ordered, performed, and interpreted by Charlann Boxer D.O.  Wrist: Limited exam of the wrist shows the patient does have a abnormality of the scaphoid bone noted with significant increase in Doppler flow. This seems to be also very on the distal radius area. Possible nonhealing fracture noted. Difficult to assess patient's scaphoid lunate ligament but potentially not intact.  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      Note: This dictation was prepared with Dragon dictation along with smaller phrase technology. Any transcriptional errors that result from this process are unintentional.

## 2016-11-22 ENCOUNTER — Ambulatory Visit: Payer: Self-pay

## 2016-11-22 ENCOUNTER — Ambulatory Visit (INDEPENDENT_AMBULATORY_CARE_PROVIDER_SITE_OTHER): Payer: BLUE CROSS/BLUE SHIELD | Admitting: Family Medicine

## 2016-11-22 ENCOUNTER — Encounter: Payer: Self-pay | Admitting: Family Medicine

## 2016-11-22 VITALS — BP 110/82 | HR 66 | Ht 66.0 in | Wt 123.0 lb

## 2016-11-22 DIAGNOSIS — M25531 Pain in right wrist: Secondary | ICD-10-CM

## 2016-11-22 DIAGNOSIS — Z23 Encounter for immunization: Secondary | ICD-10-CM | POA: Diagnosis not present

## 2016-11-22 DIAGNOSIS — M654 Radial styloid tenosynovitis [de Quervain]: Secondary | ICD-10-CM | POA: Diagnosis not present

## 2016-11-22 NOTE — Patient Instructions (Signed)
Good to see you  Overall not bad  Ice is your friend Wear the big brace at night Move the nitro over and see how it does Maybe more machine weights for a month then free weights See me again in 6-8 weeks

## 2016-11-22 NOTE — Assessment & Plan Note (Signed)
Seems to be recurrent and likely reactive from patient's healing stress reaction.  Patient is doing relatively well.  The scaphoid lunate disassociation with the nitroglycerin seems to be improving as well.  No significant changes in management except changing the placement of the nitroglycerin.  We discussed bracing at night.  And follow-up again in 4 weeks

## 2016-11-22 NOTE — Progress Notes (Signed)
[facility] [place], [place] [address] Phone: [phone] Subjective:     CC: wrist pain follow up   [id number]  Erin Stokes is a 56 y.o. female coming in for follow up for wrist pain. She has been using the nitro patch which alleviated the posterior wrist pain. She is still having pain over the thumb. She is having constant soreness with occasional sharp pain.  Patient was found to have some abnormality of the scapholunate ligament previously.  Has noticed that that has improved.  Pain seems to be more over the thumb again.  Patient did have the de Quervain's tenosynovitis previously and feels like this is oddly similar        Past Medical History:  Diagnosis Date  . Allergic rhinitis, cause unspecified   . Endometrial polyp   . Family history of ischemic heart disease   . History of basal cell carcinoma excision    nose 2013  . Irregular menstrual cycle   . Wears glasses    Past Surgical History:  Procedure Laterality Date  . basal cell cancer     removed from nose in October 2013  . DENTAL SURGERY     implants x 2  . HYMENECTOMY     as a teenager   Social History   Socioeconomic History  . Marital status: Legally Separated    Spouse name: None  . Number of children: None  . Years of education: None  . Highest education level: None  Social Needs  . Financial resource strain: None  . Food insecurity - worry: None  . Food insecurity - inability: None  . Transportation needs - medical: None  . Transportation needs - non-medical: None  Occupational History  . None  Tobacco Use  . Smoking status: Never Smoker  . Smokeless tobacco: Never Used  Substance and Sexual Activity  . Alcohol use: Yes    Alcohol/week: 8.4 oz    Types: 14 Glasses of wine per week    Comment: 2 drinks per day  . Drug use: No  . Sexual activity: None  Other Topics Concern  . None  Social History Narrative  . None   Allergies    Allergen Reactions  . Cortisone     Cortisone inj caused atrophy, skin deterioration  . Celebrex [Celecoxib] Rash    Able to take advil ,ibuprofen without problems   Family History  Problem Relation Age of Onset  . Colon cancer Neg Hx   . Esophageal cancer Neg Hx   . Rectal cancer Neg Hx   . Stomach cancer Neg Hx      Past medical history, social, surgical and family history all reviewed in electronic medical record.  No pertanent information unless stated regarding to the chief complaint.   Review of Systems:Review of systems updated and as accurate as of 11/22/16  No headache, visual changes, nausea, vomiting, diarrhea, constipation, dizziness, abdominal pain, skin rash, fevers, chills, night sweats, weight loss, swollen lymph nodes, body aches, joint swelling, muscle aches, chest pain, shortness of breath, mood changes.   Objective  Blood pressure 110/82, pulse 66, height 5\' 6"  (1.676 m), weight 123 lb (55.8 kg), last menstrual period 08/06/2012, SpO2 97 %. Systems examined below as of 11/22/16   General: No apparent distress alert and oriented x3 mood and affect normal, dressed appropriately.  HEENT: Pupils equal, extraocular movements intact  Respiratory: Patient's speak in full sentences and does not appear short of breath  Cardiovascular: No lower extremity edema, non tender, no erythema  Skin: Warm dry intact with no signs of infection or rash on extremities or on axial skeleton.  Abdomen: Soft nontender  Neuro: Cranial nerves II through XII are intact, neurovascularly intact in all extremities with 2+ DTRs and 2+ pulses.  Lymph: No lymphadenopathy of posterior or anterior cervical chain or axillae bilaterally.  Gait normal with good balance and coordination.  MSK:  Non tender with full range of motion and good stability and symmetric strength and tone of shoulders, elbows,  hip, knee and ankles bilaterally.  Wrist: Right Inspection reveals improvement in patient's  atrophy. ROM smooth and normal with good flexion and extension and ulnar/radial deviation that is symmetrical with opposite wrist. Palpation is normal over metacarpals, navicular, lunate, and TFCC; tendons without tenderness/ swelling No snuffbox tenderness. No tenderness over Canal of [place]. Strength 5/5 in all directions without pain. Positive Finkelstein, negative Tinel's and phalens. Negative Watson's test. Contralateral wrist unremarkable  MSK US performed of:  This study was ordered, performed, and interpreted by Charlann Boxer D.O.  Wrist: Previous area of avulsion fracture seems to be well-healed.  Good callus formation.  Patient though there is having a reactive synovitis noted of the common extensor tendon and the third compartment as well as abductor pollicis longus tendon sheath.  IMPRESSION: Erin Stokes     Impression and Recommendations:     This case required medical decision making of moderate complexity.      Note: This dictation was prepared with Dragon dictation along with smaller phrase technology. Any transcriptional errors that result from this process are unintentional.

## 2016-11-23 ENCOUNTER — Ambulatory Visit: Payer: BLUE CROSS/BLUE SHIELD | Admitting: Family Medicine

## 2017-01-11 ENCOUNTER — Ambulatory Visit: Payer: BLUE CROSS/BLUE SHIELD | Admitting: Family Medicine

## 2017-01-13 NOTE — Progress Notes (Signed)
[name] [place] [place], [place] [address] Phone: [phone] Subjective:    I'm seeing this patient by the request  of:    CC: Wrist pain follow-up  [id number]  Erin Stokes is a 57 y.o. female coming in with complaint of wrist pain.  Was found to have significant atrophy after a steroid injection.  And was found to have more of a de Quervain's tenosynovitis.  Wear brace at night, started on the nitroglycerin again.  Patient was to change workout routine.  Patient states that she feels that her wrist is worse than last visit. She has been wearing the brace at night on a regular basis. She did not think that the nitroglycerin patch worked. She has not been working out and has taken off 2 weeks from weight training. She also notes that writing has become painful. She also feels that her range of motion has decreased.  Worsening pain overall.  Feels like she is also having increasing swelling       Past Medical History:  Diagnosis Date  . Allergic rhinitis, cause unspecified   . Endometrial polyp   . Family history of ischemic heart disease   . History of basal cell carcinoma excision    nose 2013  . Irregular menstrual cycle   . Wears glasses    Past Surgical History:  Procedure Laterality Date  . basal cell cancer     removed from nose in October 2013  . DENTAL SURGERY     implants x 2  . DILATATION & CURETTAGE/HYSTEROSCOPY WITH MYOSURE N/A 07/16/2014   Procedure: DILATATION & CURETTAGE/HYSTEROSCOPY WITH MYOSURE;  Surgeon: Arvella Nigh, MD;  Location: [facility];  Service: Gynecology;  Laterality: N/A;  . HYMENECTOMY     as a teenager   Social History   Socioeconomic History  . Marital status: Legally Separated    Spouse name: None  . Number of children: None  . Years of education: None  . Highest education level: None  Social Needs  . Financial resource strain: None  . Food insecurity - worry: None  . Food  insecurity - inability: None  . Transportation needs - medical: None  . Transportation needs - non-medical: None  Occupational History  . None  Tobacco Use  . Smoking status: Never Smoker  . Smokeless tobacco: Never Used  Substance and Sexual Activity  . Alcohol use: Yes    Alcohol/week: 8.4 oz    Types: 14 Glasses of wine per week    Comment: 2 drinks per day  . Drug use: No  . Sexual activity: None  Other Topics Concern  . None  Social History Narrative  . None   Allergies  Allergen Reactions  . Cortisone     Cortisone inj caused atrophy, skin deterioration  . Celebrex [Celecoxib] Rash    Able to take advil ,ibuprofen without problems   Family History  Problem Relation Age of Onset  . Colon cancer Neg Hx   . Esophageal cancer Neg Hx   . Rectal cancer Neg Hx   . Stomach cancer Neg Hx      Past medical history, social, surgical and family history all reviewed in electronic medical record.  No pertanent information unless stated regarding to the chief complaint.   Review of Systems:Review of systems updated and as accurate as of 01/15/17  No headache, visual changes, nausea, vomiting, diarrhea, constipation, dizziness, abdominal pain, skin rash, fevers, chills, night sweats, weight loss,  swollen lymph nodes, body aches, muscle aches, chest pain, shortness of breath, mood changes.  Positive joint swelling of the wrist  Objective  Blood pressure 118/84, pulse 72, height 5\' 6"  (1.676 m), weight 124 lb (56.2 kg), last menstrual period 08/06/2012, SpO2 98 %. Systems examined below as of 01/15/17   General: No apparent distress alert and oriented x3 mood and affect normal, dressed appropriately.  HEENT: Pupils equal, extraocular movements intact  Respiratory: Patient's speak in full sentences and does not appear short of breath  Cardiovascular: No lower extremity edema, non tender, no erythema  Skin: Warm dry intact with no signs of infection or rash on extremities or on  axial skeleton.  Abdomen: Soft nontender  Neuro: Cranial nerves II through XII are intact, neurovascularly intact in all extremities with 2+ DTRs and 2+ pulses.  Lymph: No lymphadenopathy of posterior or anterior cervical chain or axillae bilaterally.  Gait normal with good balance and coordination.  MSK:  Non tender with full range of motion and good stability and symmetric strength and tone of shoulders, elbows,  hip, knee and ankles bilaterally.  Wrist: Right  inspection increasing swelling noted previously.  The right Significant decrease in range of motion from previous exam Pain over the abductor pollicis longus with swelling in the area.  Positive de Quervain's.  Patient though does have pain over the TFCC as well.  Mild pain in the anatomical snuffbox Positive Watson's test. Contralateral wrist unremarkable  MSK US performed of: Right wrist This study was ordered, performed, and interpreted by Charlann Boxer D.O.  Wrist: Significant increase in swelling around.  Patient does have what appeared to be either an accessory tendon in the area as well could be contributing.  The underlying bone does have increasing Doppler flow.  Possibly nonhealing distal radius fracture or mild abnormality of the scaphoid now noted.  TFCC does have increasing Doppler flow and mild hypoechoic changes as well.  IMPRESSION: De Quervain's tenosynovitis, possible nonhealing scaphoid fracture, possible TFCC tear    Impression and Recommendations:     This case required medical decision making of moderate complexity.      Note: This dictation was prepared with Dragon dictation along with smaller phrase technology. Any transcriptional errors that result from this process are unintentional.

## 2017-01-15 ENCOUNTER — Ambulatory Visit (INDEPENDENT_AMBULATORY_CARE_PROVIDER_SITE_OTHER): Payer: BLUE CROSS/BLUE SHIELD | Admitting: Family Medicine

## 2017-01-15 ENCOUNTER — Ambulatory Visit: Payer: Self-pay

## 2017-01-15 ENCOUNTER — Encounter: Payer: Self-pay | Admitting: Family Medicine

## 2017-01-15 VITALS — BP 118/84 | HR 72 | Ht 66.0 in | Wt 124.0 lb

## 2017-01-15 DIAGNOSIS — M25531 Pain in right wrist: Secondary | ICD-10-CM

## 2017-01-15 DIAGNOSIS — M25331 Other instability, right wrist: Secondary | ICD-10-CM

## 2017-01-15 NOTE — Assessment & Plan Note (Signed)
More pain at this time.  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  Depending on the findings we will consider possible need for surgical intervention but I do feel an MR arthrogram is necessary with this giving more pain and discomfort.  Patient is in agreement with plan.  Follow-up with me again after the MRI and we will discuss further treatment options.

## 2017-01-15 NOTE — Patient Instructions (Signed)
Good to see you  MR arthrogram of the wrist  Lets see what it shows Stop the nitro  Happy New Year!

## 2017-01-16 ENCOUNTER — Other Ambulatory Visit: Payer: Self-pay | Admitting: Family Medicine

## 2017-01-17 ENCOUNTER — Other Ambulatory Visit: Payer: Self-pay | Admitting: *Deleted

## 2017-01-17 DIAGNOSIS — M25531 Pain in right wrist: Secondary | ICD-10-CM

## 2017-01-22 ENCOUNTER — Other Ambulatory Visit: Payer: Self-pay | Admitting: Obstetrics and Gynecology

## 2017-01-22 DIAGNOSIS — M25531 Pain in right wrist: Secondary | ICD-10-CM | POA: Diagnosis not present

## 2017-01-22 DIAGNOSIS — Z139 Encounter for screening, unspecified: Secondary | ICD-10-CM

## 2017-01-22 DIAGNOSIS — M654 Radial styloid tenosynovitis [de Quervain]: Secondary | ICD-10-CM | POA: Diagnosis not present

## 2017-01-22 DIAGNOSIS — Z0100 Encounter for examination of eyes and vision without abnormal findings: Secondary | ICD-10-CM | POA: Diagnosis not present

## 2017-01-25 DIAGNOSIS — M654 Radial styloid tenosynovitis [de Quervain]: Secondary | ICD-10-CM | POA: Diagnosis not present

## 2017-01-29 ENCOUNTER — Other Ambulatory Visit: Payer: BLUE CROSS/BLUE SHIELD

## 2017-01-31 DIAGNOSIS — M654 Radial styloid tenosynovitis [de Quervain]: Secondary | ICD-10-CM | POA: Diagnosis not present

## 2017-02-01 DIAGNOSIS — Z01419 Encounter for gynecological examination (general) (routine) without abnormal findings: Secondary | ICD-10-CM | POA: Diagnosis not present

## 2017-02-01 DIAGNOSIS — Z6820 Body mass index (BMI) 20.0-20.9, adult: Secondary | ICD-10-CM | POA: Diagnosis not present

## 2017-02-13 ENCOUNTER — Other Ambulatory Visit: Payer: Self-pay

## 2017-02-13 MED ORDER — IPRATROPIUM BROMIDE 0.03 % NA SOLN: NASAL | 0 refills | Status: DC

## 2017-02-15 ENCOUNTER — Telehealth: Payer: Self-pay | Admitting: Internal Medicine

## 2017-02-15 NOTE — Telephone Encounter (Signed)
Called patient and let her know.  

## 2017-02-15 NOTE — Telephone Encounter (Signed)
I am not currently accepting new patients.  °

## 2017-02-15 NOTE — Telephone Encounter (Signed)
Copied from [place]. Topic: Appointment Scheduling - Scheduling Inquiry for Clinic >> Feb 15, 2017  9:37 AM Ether Griffins B wrote: Reason for CRM: pt would like to set up her CPE but she would like to switch providers from Dr. Sharlet Salina to Dr. Quay Burow

## 2017-02-15 NOTE — Telephone Encounter (Signed)
Patient is requesting to transfer care from Dr. Sharlet Salina to Dr. Quay Burow. Dr. Sharlet Salina is is this okay with you? Serita Sheller this this okay with you?

## 2017-02-23 ENCOUNTER — Ambulatory Visit
Admission: RE | Admit: 2017-02-23 | Discharge: 2017-02-23 | Disposition: A | Discharge status: Home / Self Care | Payer: BLUE CROSS/BLUE SHIELD | Source: Ambulatory Visit | Attending: Obstetrics and Gynecology | Admitting: Obstetrics and Gynecology

## 2017-02-23 DIAGNOSIS — Z139 Encounter for screening, unspecified: Secondary | ICD-10-CM

## 2017-02-23 DIAGNOSIS — Z1231 Encounter for screening mammogram for malignant neoplasm of breast: Secondary | ICD-10-CM | POA: Diagnosis not present

## 2017-03-08 DIAGNOSIS — M65831 Other synovitis and tenosynovitis, right forearm: Secondary | ICD-10-CM | POA: Diagnosis not present

## 2017-03-08 DIAGNOSIS — G8918 Other acute postprocedural pain: Secondary | ICD-10-CM | POA: Diagnosis not present

## 2017-03-08 DIAGNOSIS — M659 Synovitis and tenosynovitis, unspecified: Secondary | ICD-10-CM | POA: Diagnosis not present

## 2017-03-08 DIAGNOSIS — M79641 Pain in right hand: Secondary | ICD-10-CM | POA: Diagnosis not present

## 2017-03-08 DIAGNOSIS — M25531 Pain in right wrist: Secondary | ICD-10-CM | POA: Diagnosis not present

## 2017-03-08 HISTORY — PX: WRIST SURGERY: SHX841

## 2017-03-19 DIAGNOSIS — M654 Radial styloid tenosynovitis [de Quervain]: Secondary | ICD-10-CM | POA: Diagnosis not present

## 2017-03-19 DIAGNOSIS — M25531 Pain in right wrist: Secondary | ICD-10-CM | POA: Diagnosis not present

## 2017-03-21 DIAGNOSIS — M25531 Pain in right wrist: Secondary | ICD-10-CM | POA: Diagnosis not present

## 2017-03-26 DIAGNOSIS — M25531 Pain in right wrist: Secondary | ICD-10-CM | POA: Diagnosis not present

## 2017-04-02 DIAGNOSIS — M25531 Pain in right wrist: Secondary | ICD-10-CM | POA: Diagnosis not present

## 2017-04-09 DIAGNOSIS — M25531 Pain in right wrist: Secondary | ICD-10-CM | POA: Diagnosis not present

## 2017-04-30 DIAGNOSIS — M25531 Pain in right wrist: Secondary | ICD-10-CM | POA: Diagnosis not present

## 2017-05-03 DIAGNOSIS — R5382 Chronic fatigue, unspecified: Secondary | ICD-10-CM | POA: Diagnosis not present

## 2017-05-03 DIAGNOSIS — R5383 Other fatigue: Secondary | ICD-10-CM | POA: Diagnosis not present

## 2017-05-03 DIAGNOSIS — R635 Abnormal weight gain: Secondary | ICD-10-CM | POA: Diagnosis not present

## 2017-05-03 DIAGNOSIS — N951 Menopausal and female climacteric states: Secondary | ICD-10-CM | POA: Diagnosis not present

## 2017-06-05 ENCOUNTER — Telehealth: Payer: Self-pay

## 2017-06-05 NOTE — Telephone Encounter (Signed)
Copied from [place] [id number]. Topic: Quick Communication - See Telephone Encounter >> Jun 05, 2017  1:03 PM Hewitt Shorts wrote: Pt is needing to talk with Dr. Sharlet Salina regarding measles and shingles   Best number is [phone]

## 2017-06-05 NOTE — Telephone Encounter (Signed)
appt made

## 2017-06-05 NOTE — Telephone Encounter (Signed)
Per Dr. Sharlet Salina can you please make patient an appointment to discuss measles. Thank you

## 2017-06-07 ENCOUNTER — Encounter: Payer: Self-pay | Admitting: Internal Medicine

## 2017-06-07 ENCOUNTER — Other Ambulatory Visit (INDEPENDENT_AMBULATORY_CARE_PROVIDER_SITE_OTHER): Payer: BLUE CROSS/BLUE SHIELD

## 2017-06-07 ENCOUNTER — Ambulatory Visit (INDEPENDENT_AMBULATORY_CARE_PROVIDER_SITE_OTHER): Payer: BLUE CROSS/BLUE SHIELD | Admitting: Internal Medicine

## 2017-06-07 VITALS — BP 100/60 | HR 73 | Temp 98.2°F | Ht 66.0 in | Wt 122.0 lb

## 2017-06-07 DIAGNOSIS — Z0000 Encounter for general adult medical examination without abnormal findings: Secondary | ICD-10-CM | POA: Diagnosis not present

## 2017-06-07 DIAGNOSIS — Z1159 Encounter for screening for other viral diseases: Secondary | ICD-10-CM

## 2017-06-07 DIAGNOSIS — Z7189 Other specified counseling: Secondary | ICD-10-CM

## 2017-06-07 DIAGNOSIS — Z7184 Encounter for health counseling related to travel: Secondary | ICD-10-CM | POA: Insufficient documentation

## 2017-06-07 LAB — COMPREHENSIVE METABOLIC PANEL
ALT: 22 U/L (ref 0–35)
AST: 23 U/L (ref 0–37)
Albumin: 4.4 g/dL (ref 3.5–5.2)
Alkaline Phosphatase: 55 U/L (ref 39–117)
BILIRUBIN TOTAL: 0.7 mg/dL (ref 0.2–1.2)
BUN: 19 mg/dL (ref 6–23)
CO2: 27 mEq/L (ref 19–32)
CREATININE: 0.81 mg/dL (ref 0.40–1.20)
Calcium: 9.4 mg/dL (ref 8.4–10.5)
Chloride: 103 mEq/L (ref 96–112)
GFR: 77.58 mL/min (ref >60.00)
GLUCOSE: 96 mg/dL (ref 70–99)
Potassium: 3.9 mEq/L (ref 3.5–5.1)
Sodium: 138 mEq/L (ref 135–145)
Total Protein: 7.1 g/dL (ref 6.0–8.3)

## 2017-06-07 LAB — LIPID PANEL
CHOL/HDL RATIO: 2
Cholesterol: 212 mg/dL — ABNORMAL HIGH (ref 0–200)
HDL: 124.1 mg/dL (ref >39.00)
LDL CALC: 75 mg/dL (ref 0–99)
NonHDL: 88.27
TRIGLYCERIDES: 65 mg/dL (ref 0.0–149.0)
VLDL: 13 mg/dL (ref 0.0–40.0)

## 2017-06-07 LAB — CBC
HCT: 41 % (ref 36.0–46.0)
Hemoglobin: 13.8 g/dL (ref 12.0–15.0)
MCHC: 33.7 g/dL (ref 30.0–36.0)
MCV: 94.1 fl (ref 78.0–100.0)
Platelets: 233 10*3/uL (ref 150.0–400.0)
RBC: 4.36 Mil/uL (ref 3.87–5.11)
RDW: 13.6 % (ref 11.5–15.5)
WBC: 5.7 10*3/uL (ref 4.0–10.5)

## 2017-06-07 MED ORDER — IPRATROPIUM BROMIDE 0.03 % NA SOLN: NASAL | 6 refills | Status: DC

## 2017-06-07 NOTE — Progress Notes (Signed)
   Subjective:    Patient ID: Erin Stokes, female    DOB: [birth year], 57 y.o.   MRN: [id number]  HPI The patient is a 57 YO female coming in for travel advice. She is going to [place], [place], and [place] for travel. She denies knowing if she has gotten hepatitis A in the past. She is not going to be doing any work while traveling. She also wants to know if she needs to be tested for measles immunity due to her birth year. She thinks she got all childhood vaccines. Denies any new concerns.   PMH, [facility], social history reviewed and updated.   Review of Systems  Constitutional: Negative.   HENT: Negative.   Eyes: Negative.   Respiratory: Negative for cough, chest tightness and shortness of breath.   Cardiovascular: Negative for chest pain, palpitations and leg swelling.  Gastrointestinal: Negative for abdominal distention, abdominal pain, constipation, diarrhea, nausea and vomiting.  Musculoskeletal: Negative.   Skin: Negative.   Neurological: Negative.   Psychiatric/Behavioral: Negative.       Objective:   Physical Exam  Constitutional: She is oriented to person, place, and time. She appears well-developed and well-nourished.  HENT:  Head: Normocephalic and atraumatic.  Eyes: EOM are normal.  Neck: Normal range of motion.  Cardiovascular: Normal rate and regular rhythm.  Pulmonary/Chest: Effort normal and breath sounds normal. No respiratory distress. She has no wheezes. She has no rales.  Abdominal: Soft. Bowel sounds are normal. She exhibits no distension. There is no tenderness. There is no rebound.  Musculoskeletal: She exhibits no edema.  Neurological: She is alert and oriented to person, place, and time. Coordination normal.  Skin: Skin is warm and dry.  Psychiatric: She has a normal mood and affect.   Vitals:   06/07/17 1549  BP: 100/60  Pulse: 73  Temp: 98.2 F (36.8 C)  TempSrc: Oral  SpO2: 99%  Weight: 122 lb (55.3 kg)  Height: 5\' 6"  (1.676 m)        Assessment & Plan:

## 2017-06-07 NOTE — Assessment & Plan Note (Signed)
Advised hep a but she wants to know if she is already immune so ordered hep a testing. Checking measles antibody and if negative or inconclusive needs MMR booster. Advised to take all meds with her and any otcs that she uses for the trip. Checking hep c screening as well as this is indicated for her age.

## 2017-06-07 NOTE — Patient Instructions (Signed)
We will check the labs today for the hepatitis A, measles to see if you need the vaccine for that. You will get a call next week.   We are adding you to the shingles waiting list.   Have a great time on the trip.

## 2017-06-07 NOTE — Assessment & Plan Note (Signed)
Checking labs as none in several years. Up to date on most age screening and immunizations. Added to shingrix waiting list.

## 2017-06-08 LAB — HEPATITIS C ANTIBODY
Hepatitis C Ab: NONREACTIVE
SIGNAL TO CUT-OFF: 0.01 (ref <1.00)

## 2017-06-08 LAB — RUBEOLA ANTIBODY IGG: RUBEOLA IGG: 217 [AU]/ml

## 2017-06-08 LAB — HEPATITIS A ANTIBODY, IGM: Hep A IgM: NONREACTIVE

## 2017-06-11 ENCOUNTER — Ambulatory Visit (INDEPENDENT_AMBULATORY_CARE_PROVIDER_SITE_OTHER): Payer: BLUE CROSS/BLUE SHIELD

## 2017-06-11 DIAGNOSIS — Z23 Encounter for immunization: Secondary | ICD-10-CM | POA: Diagnosis not present

## 2017-09-25 ENCOUNTER — Ambulatory Visit (INDEPENDENT_AMBULATORY_CARE_PROVIDER_SITE_OTHER): Payer: BLUE CROSS/BLUE SHIELD | Admitting: Internal Medicine

## 2017-09-25 ENCOUNTER — Encounter: Payer: Self-pay | Admitting: Internal Medicine

## 2017-09-25 DIAGNOSIS — J0110 Acute frontal sinusitis, unspecified: Secondary | ICD-10-CM | POA: Diagnosis not present

## 2017-09-25 DIAGNOSIS — J019 Acute sinusitis, unspecified: Secondary | ICD-10-CM | POA: Insufficient documentation

## 2017-09-25 MED ORDER — AMOXICILLIN-POT CLAVULANATE 875-125 MG PO TABS: 1.0000 | ORAL_TABLET | Freq: Two times a day (BID) | ORAL | 0 refills | Status: DC

## 2017-09-25 NOTE — Patient Instructions (Signed)
We have sent in augmentin to take 1 pill twice a day for 1 week. 

## 2017-09-25 NOTE — Progress Notes (Signed)
   Subjective:    Patient ID: Erin Stokes, female    DOB: [birth year], 57 y.o.   MRN: [id number]  HPI The patient is a 57 YO female coming in for sinus congestion. Denies fevers or chills. Started 1-2 weeks ago. Overall symptoms were improving but then worsened again. She is taking nose spray and claritin daily without improvement. Denies cough or SOB. Is having some headaches and sinus pressure. Some fullness in her ears. Overall is worsening. Has taken some otc medications as well which did not help.   Review of Systems  Constitutional: Positive for activity change, appetite change and fatigue. Negative for chills, fever and unexpected weight change.  HENT: Positive for congestion, ear pain, postnasal drip, rhinorrhea, sinus pressure, sinus pain and sore throat. Negative for ear discharge, sneezing, tinnitus, trouble swallowing and voice change.   Eyes: Negative.   Respiratory: Negative for cough, chest tightness, shortness of breath and wheezing.   Cardiovascular: Negative.   Gastrointestinal: Negative.   Musculoskeletal: Negative for myalgias.  Neurological: Negative.       Objective:   Physical Exam  Constitutional: She is oriented to person, place, and time. She appears well-developed and well-nourished.  HENT:  Head: Normocephalic and atraumatic.  Oropharynx with redness and clear drainage, nose with swollen turbinates and crusting, TMs bulging bilaterally, sinus pressure frontal  Eyes: EOM are normal.  Neck: Normal range of motion. No thyromegaly present.  Cardiovascular: Normal rate and regular rhythm.  Pulmonary/Chest: Effort normal and breath sounds normal. No respiratory distress. She has no wheezes. She has no rales.  Abdominal: Soft.  Musculoskeletal: She exhibits no tenderness.  Lymphadenopathy:    She has cervical adenopathy.  Neurological: She is alert and oriented to person, place, and time.  Skin: Skin is warm and dry.   Vitals:   09/25/17 1459  BP: 100/68    Pulse: 75  Temp: 98.4 F (36.9 C)  TempSrc: Oral  SpO2: 99%  Weight: 123 lb (55.8 kg)  Height: 5\' 6"  (1.676 m)      Assessment & Plan:

## 2017-09-25 NOTE — Assessment & Plan Note (Signed)
Rx for augmentin and continue claritin and nose spray.

## 2017-09-26 DIAGNOSIS — D1801 Hemangioma of skin and subcutaneous tissue: Secondary | ICD-10-CM | POA: Diagnosis not present

## 2017-09-26 DIAGNOSIS — Z85828 Personal history of other malignant neoplasm of skin: Secondary | ICD-10-CM | POA: Diagnosis not present

## 2017-10-10 ENCOUNTER — Ambulatory Visit (INDEPENDENT_AMBULATORY_CARE_PROVIDER_SITE_OTHER): Payer: BLUE CROSS/BLUE SHIELD

## 2017-10-10 DIAGNOSIS — Z23 Encounter for immunization: Secondary | ICD-10-CM

## 2017-10-13 DIAGNOSIS — M25561 Pain in right knee: Secondary | ICD-10-CM | POA: Diagnosis not present

## 2017-10-29 ENCOUNTER — Telehealth: Payer: Self-pay | Admitting: Internal Medicine

## 2017-10-29 NOTE — Telephone Encounter (Signed)
Of course, this is per standing protocol.

## 2017-10-29 NOTE — Telephone Encounter (Signed)
Patient is going to call back and set up this appointment.

## 2017-10-29 NOTE — Telephone Encounter (Signed)
Copied from [place] [id number]. Topic: Appointment Scheduling - Scheduling Inquiry for Clinic >> Oct 29, 2017 10:28 AM Lennox Solders wrote: Reason for CRM: pt would like a shingrix vaccine.  It is okay to schedule this?

## 2017-11-05 ENCOUNTER — Encounter: Payer: Self-pay | Admitting: Gastroenterology

## 2017-11-14 DIAGNOSIS — M25561 Pain in right knee: Secondary | ICD-10-CM | POA: Diagnosis not present

## 2017-11-21 DIAGNOSIS — M25561 Pain in right knee: Secondary | ICD-10-CM | POA: Diagnosis not present

## 2017-11-26 ENCOUNTER — Ambulatory Visit: Payer: BLUE CROSS/BLUE SHIELD

## 2017-11-28 ENCOUNTER — Ambulatory Visit (INDEPENDENT_AMBULATORY_CARE_PROVIDER_SITE_OTHER): Payer: BLUE CROSS/BLUE SHIELD

## 2017-11-28 DIAGNOSIS — Z23 Encounter for immunization: Secondary | ICD-10-CM

## 2017-11-28 DIAGNOSIS — Z299 Encounter for prophylactic measures, unspecified: Secondary | ICD-10-CM

## 2017-11-29 DIAGNOSIS — M25561 Pain in right knee: Secondary | ICD-10-CM | POA: Diagnosis not present

## 2017-12-03 DIAGNOSIS — M7121 Synovial cyst of popliteal space [Baker], right knee: Secondary | ICD-10-CM | POA: Diagnosis not present

## 2017-12-03 DIAGNOSIS — M25561 Pain in right knee: Secondary | ICD-10-CM | POA: Diagnosis not present

## 2017-12-12 ENCOUNTER — Ambulatory Visit (INDEPENDENT_AMBULATORY_CARE_PROVIDER_SITE_OTHER): Payer: BLUE CROSS/BLUE SHIELD

## 2017-12-12 DIAGNOSIS — Z23 Encounter for immunization: Secondary | ICD-10-CM

## 2017-12-12 DIAGNOSIS — Z299 Encounter for prophylactic measures, unspecified: Secondary | ICD-10-CM

## 2017-12-13 DIAGNOSIS — M25561 Pain in right knee: Secondary | ICD-10-CM | POA: Diagnosis not present

## 2017-12-17 DIAGNOSIS — M25561 Pain in right knee: Secondary | ICD-10-CM | POA: Diagnosis not present

## 2017-12-20 DIAGNOSIS — M25561 Pain in right knee: Secondary | ICD-10-CM | POA: Diagnosis not present

## 2017-12-26 DIAGNOSIS — M25561 Pain in right knee: Secondary | ICD-10-CM | POA: Diagnosis not present

## 2017-12-26 DIAGNOSIS — M7121 Synovial cyst of popliteal space [Baker], right knee: Secondary | ICD-10-CM | POA: Diagnosis not present

## 2017-12-28 DIAGNOSIS — M25561 Pain in right knee: Secondary | ICD-10-CM | POA: Diagnosis not present

## 2018-01-01 DIAGNOSIS — M25561 Pain in right knee: Secondary | ICD-10-CM | POA: Diagnosis not present

## 2018-01-14 ENCOUNTER — Other Ambulatory Visit: Payer: Self-pay | Admitting: Internal Medicine

## 2018-01-14 DIAGNOSIS — Z1231 Encounter for screening mammogram for malignant neoplasm of breast: Secondary | ICD-10-CM

## 2018-01-14 DIAGNOSIS — M25561 Pain in right knee: Secondary | ICD-10-CM | POA: Diagnosis not present

## 2018-01-15 DIAGNOSIS — M25561 Pain in right knee: Secondary | ICD-10-CM | POA: Diagnosis not present

## 2018-01-25 DIAGNOSIS — M25561 Pain in right knee: Secondary | ICD-10-CM | POA: Diagnosis not present

## 2018-01-25 DIAGNOSIS — M1711 Unilateral primary osteoarthritis, right knee: Secondary | ICD-10-CM | POA: Diagnosis not present

## 2018-01-29 ENCOUNTER — Ambulatory Visit (INDEPENDENT_AMBULATORY_CARE_PROVIDER_SITE_OTHER): Payer: BLUE CROSS/BLUE SHIELD

## 2018-01-29 DIAGNOSIS — Z299 Encounter for prophylactic measures, unspecified: Secondary | ICD-10-CM

## 2018-01-29 DIAGNOSIS — Z23 Encounter for immunization: Secondary | ICD-10-CM | POA: Diagnosis not present

## 2018-02-04 DIAGNOSIS — H04123 Dry eye syndrome of bilateral lacrimal glands: Secondary | ICD-10-CM | POA: Diagnosis not present

## 2018-02-04 DIAGNOSIS — H5203 Hypermetropia, bilateral: Secondary | ICD-10-CM | POA: Diagnosis not present

## 2018-02-04 DIAGNOSIS — H524 Presbyopia: Secondary | ICD-10-CM | POA: Diagnosis not present

## 2018-02-04 DIAGNOSIS — Z6820 Body mass index (BMI) 20.0-20.9, adult: Secondary | ICD-10-CM | POA: Diagnosis not present

## 2018-02-04 DIAGNOSIS — Z01419 Encounter for gynecological examination (general) (routine) without abnormal findings: Secondary | ICD-10-CM | POA: Diagnosis not present

## 2018-02-25 ENCOUNTER — Ambulatory Visit
Admission: RE | Admit: 2018-02-25 | Discharge: 2018-02-25 | Disposition: A | Discharge status: Home / Self Care | Payer: BLUE CROSS/BLUE SHIELD | Source: Ambulatory Visit | Attending: Internal Medicine | Admitting: Internal Medicine

## 2018-02-25 DIAGNOSIS — Z1231 Encounter for screening mammogram for malignant neoplasm of breast: Secondary | ICD-10-CM | POA: Diagnosis not present

## 2018-02-25 LAB — HM MAMMOGRAPHY

## 2018-03-06 DIAGNOSIS — M1711 Unilateral primary osteoarthritis, right knee: Secondary | ICD-10-CM | POA: Diagnosis not present

## 2018-03-15 DIAGNOSIS — M1711 Unilateral primary osteoarthritis, right knee: Secondary | ICD-10-CM | POA: Diagnosis not present

## 2018-03-21 DIAGNOSIS — M1711 Unilateral primary osteoarthritis, right knee: Secondary | ICD-10-CM | POA: Diagnosis not present

## 2018-06-11 ENCOUNTER — Other Ambulatory Visit: Payer: Self-pay | Admitting: Internal Medicine

## 2018-07-15 DIAGNOSIS — Z03818 Encounter for observation for suspected exposure to other biological agents ruled out: Secondary | ICD-10-CM | POA: Diagnosis not present

## 2018-08-26 DIAGNOSIS — Z03818 Encounter for observation for suspected exposure to other biological agents ruled out: Secondary | ICD-10-CM | POA: Diagnosis not present

## 2018-08-26 DIAGNOSIS — U071 COVID-19: Secondary | ICD-10-CM | POA: Diagnosis not present

## 2018-08-31 DIAGNOSIS — S0990XA Unspecified injury of head, initial encounter: Secondary | ICD-10-CM | POA: Diagnosis not present

## 2018-08-31 DIAGNOSIS — M79645 Pain in left finger(s): Secondary | ICD-10-CM | POA: Diagnosis not present

## 2018-08-31 DIAGNOSIS — S6992XA Unspecified injury of left wrist, hand and finger(s), initial encounter: Secondary | ICD-10-CM | POA: Diagnosis not present

## 2018-08-31 DIAGNOSIS — R55 Syncope and collapse: Secondary | ICD-10-CM | POA: Diagnosis not present

## 2018-08-31 DIAGNOSIS — R42 Dizziness and giddiness: Secondary | ICD-10-CM | POA: Diagnosis not present

## 2018-08-31 DIAGNOSIS — Y9355 Activity, bike riding: Secondary | ICD-10-CM | POA: Diagnosis not present

## 2018-08-31 DIAGNOSIS — S7011XA Contusion of right thigh, initial encounter: Secondary | ICD-10-CM | POA: Diagnosis not present

## 2018-08-31 DIAGNOSIS — M79651 Pain in right thigh: Secondary | ICD-10-CM | POA: Diagnosis not present

## 2018-08-31 DIAGNOSIS — W19XXXA Unspecified fall, initial encounter: Secondary | ICD-10-CM | POA: Diagnosis not present

## 2018-08-31 DIAGNOSIS — I959 Hypotension, unspecified: Secondary | ICD-10-CM | POA: Diagnosis not present

## 2018-08-31 DIAGNOSIS — R52 Pain, unspecified: Secondary | ICD-10-CM | POA: Diagnosis not present

## 2018-09-05 ENCOUNTER — Other Ambulatory Visit: Payer: Self-pay

## 2018-09-05 ENCOUNTER — Encounter: Payer: Self-pay | Admitting: Internal Medicine

## 2018-09-05 ENCOUNTER — Ambulatory Visit (INDEPENDENT_AMBULATORY_CARE_PROVIDER_SITE_OTHER): Payer: BC Managed Care – PPO | Admitting: Internal Medicine

## 2018-09-05 VITALS — BP 110/80 | HR 82 | Temp 98.2°F | Ht 66.0 in | Wt 127.0 lb

## 2018-09-05 DIAGNOSIS — M79645 Pain in left finger(s): Secondary | ICD-10-CM

## 2018-09-05 DIAGNOSIS — U071 COVID-19: Secondary | ICD-10-CM | POA: Diagnosis not present

## 2018-09-05 DIAGNOSIS — S8011XA Contusion of right lower leg, initial encounter: Secondary | ICD-10-CM

## 2018-09-05 DIAGNOSIS — S8011XD Contusion of right lower leg, subsequent encounter: Secondary | ICD-10-CM | POA: Insufficient documentation

## 2018-09-05 NOTE — Assessment & Plan Note (Signed)
Advised ibuprofen and tylenol for pain. No fracture on x-ray which was reviewed during visit.

## 2018-09-05 NOTE — Patient Instructions (Signed)
You can use ice, tylenol or ibuprofen for the pain.   The bruising should fade in 1-2 weeks.   The swelling on the leg could take a month or two but will go back to normal.   It is okay to do activity just hold off on things that hurt.   Make sure to wear a helmet always while on the bike.
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
## 2018-09-18 ENCOUNTER — Encounter: Payer: Self-pay | Admitting: Internal Medicine
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
## 2018-10-08 ENCOUNTER — Other Ambulatory Visit: Payer: Self-pay

## 2018-10-08 ENCOUNTER — Encounter: Payer: Self-pay | Admitting: Internal Medicine

## 2018-10-08 ENCOUNTER — Ambulatory Visit (INDEPENDENT_AMBULATORY_CARE_PROVIDER_SITE_OTHER): Payer: BC Managed Care – PPO | Admitting: Internal Medicine

## 2018-10-08 VITALS — BP 112/74 | HR 96 | Temp 99.6°F | Resp 16 | Ht 66.0 in | Wt 124.0 lb

## 2018-10-08 DIAGNOSIS — L03115 Cellulitis of right lower limb: Secondary | ICD-10-CM

## 2018-10-08 DIAGNOSIS — L0390 Cellulitis, unspecified: Secondary | ICD-10-CM | POA: Insufficient documentation

## 2018-10-08 DIAGNOSIS — S8011XD Contusion of right lower leg, subsequent encounter: Secondary | ICD-10-CM

## 2018-10-08 MED ORDER — AMOXICILLIN-POT CLAVULANATE 875-125 MG PO TABS: 1.0000 | ORAL_TABLET | Freq: Two times a day (BID) | ORAL | 0 refills | Status: DC
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
## 2018-10-08 NOTE — Assessment & Plan Note (Signed)
Surrounding cellulitis of hematoma, hematoma possibly infected as well Start on Augmentin twice daily x10 days We will refer to surgery for possible I&D She will call with any concerns or questions prior to seeing surgery in particular if her symptoms worsen in any way

## 2018-10-08 NOTE — Patient Instructions (Signed)
Start Augmentin twice daily.   A referral for surgery was ordered.  They will contact you.

## 2018-10-09 DIAGNOSIS — T792XXA Traumatic secondary and recurrent hemorrhage and seroma, initial encounter: Secondary | ICD-10-CM | POA: Diagnosis not present

## 2018-10-11 ENCOUNTER — Encounter: Payer: Self-pay | Admitting: Internal Medicine

## 2018-10-14 ENCOUNTER — Ambulatory Visit: Payer: BC Managed Care – PPO

## 2018-10-14 DIAGNOSIS — S62627A Displaced fracture of medial phalanx of left little finger, initial encounter for closed fracture: Secondary | ICD-10-CM | POA: Diagnosis not present

## 2018-10-14 DIAGNOSIS — M79642 Pain in left hand: Secondary | ICD-10-CM | POA: Diagnosis not present

## 2018-10-15 ENCOUNTER — Other Ambulatory Visit: Payer: Self-pay | Admitting: Internal Medicine

## 2018-10-15 MED ORDER — DOXYCYCLINE HYCLATE 100 MG PO TABS: 100.0000 mg | ORAL_TABLET | Freq: Two times a day (BID) | ORAL | 0 refills | Status: DC

## 2018-10-15 NOTE — Telephone Encounter (Signed)
Pt aware.

## 2018-10-15 NOTE — Telephone Encounter (Signed)
Pt stated she sent a message to Dr. Quay Burow Pt doesn't have a fever anymore but still has redness. [facility] decided not to send culture to the lab but suggested maybe Pt to change to a Rx for mercer due to redness in the same area. Area is not worse but not better/ please advise

## 2018-10-15 NOTE — Telephone Encounter (Signed)
Stop the augmentin.  Lets start doxycycline 100 mg twice daily for 7 days.  rx pending.

## 2018-10-16 DIAGNOSIS — S93401A Sprain of unspecified ligament of right ankle, initial encounter: Secondary | ICD-10-CM | POA: Diagnosis not present

## 2018-10-16 DIAGNOSIS — M79671 Pain in right foot: Secondary | ICD-10-CM | POA: Diagnosis not present

## 2018-10-16 DIAGNOSIS — M7741 Metatarsalgia, right foot: Secondary | ICD-10-CM | POA: Diagnosis not present

## 2018-10-16 DIAGNOSIS — M25571 Pain in right ankle and joints of right foot: Secondary | ICD-10-CM | POA: Diagnosis not present

## 2018-10-23 ENCOUNTER — Other Ambulatory Visit: Payer: Self-pay

## 2018-10-23 ENCOUNTER — Ambulatory Visit (INDEPENDENT_AMBULATORY_CARE_PROVIDER_SITE_OTHER): Payer: BC Managed Care – PPO

## 2018-10-23 DIAGNOSIS — Z23 Encounter for immunization: Secondary | ICD-10-CM

## 2018-10-24 DIAGNOSIS — S7011XD Contusion of right thigh, subsequent encounter: Secondary | ICD-10-CM | POA: Diagnosis not present

## 2018-10-28 NOTE — Progress Notes (Signed)
Subjective:    Patient ID: Erin Stokes, female    DOB: 1960-04-09, 58 y.o.   MRN: [id number]  HPI  She is here to establish with a new pcp.  She is here for a physical exam.   She sees her gynecologist yearly.  She has no major concerns, but some of her overall concerns are her family history.  Her mom has hypothyroidism, sister has history of thyroid cancer and her dad had diabetes.      Medications and allergies reviewed with patient and updated if appropriate.  Patient Active Problem List   Diagnosis Date Noted  . Cellulitis 10/08/2018  . Leg hematoma, right, subsequent encounter 09/05/2018  . Finger pain, left 09/05/2018  . De Quervain's tenosynovitis, right 11/22/2016  . Endometrial polyp 07/16/2014    Class: Present on Admission  . Allergic rhinitis 05/15/2007    Current Outpatient Medications on File Prior to Visit  Medication Sig Dispense Refill  . Ascorbic Acid (VITAMIN C PO) Take 500 mg by mouth daily.    Marland Kitchen CALCIUM PO Take by mouth. With magnesium    . Cholecalciferol (VITAMIN D-3 PO) Take 1,000 Units by mouth daily.    Marland Kitchen ibuprofen (ADVIL,MOTRIN) 100 MG tablet Take 100 mg by mouth every 6 (six) hours as needed.      Marland Kitchen ipratropium (ATROVENT) 0.03 % nasal spray USE 1 SPRAY IN EACH NOSTRIL THREE TIMES DAILY 30 mL 6  . loratadine (CLARITIN) 10 MG tablet Take 10 mg by mouth daily.      Marland Kitchen MELATONIN PO Take 2 mg by mouth as needed.     . Multiple Vitamins-Minerals (MULTIVITAMIN PO) Take by mouth daily.     No current facility-administered medications on file prior to visit.     Past Medical History:  Diagnosis Date  . Allergic rhinitis, cause unspecified   . Endometrial polyp   . Family history of ischemic heart disease   . History of basal cell carcinoma excision    nose 2013  . Irregular menstrual cycle   . Wears glasses     Past Surgical History:  Procedure Laterality Date  . basal cell cancer     removed from nose in October 2013  . DENTAL  SURGERY     implants x 2  . DILATATION & CURETTAGE/HYSTEROSCOPY WITH MYOSURE N/A 07/16/2014   Procedure: DILATATION & CURETTAGE/HYSTEROSCOPY WITH MYOSURE;  Surgeon: Arvella Nigh, MD;  Location: [place];  Service: Gynecology;  Laterality: N/A;  . HYMENECTOMY     as a teenager  . WRIST SURGERY Right 03/08/2017    Social History   Socioeconomic History  . Marital status: Divorced    Spouse name: Not on file  . Number of children: Not on file  . Years of education: Not on file  . Highest education level: Not on file  Occupational History  . Not on file  Social Needs  . Financial resource strain: Not on file  . Food insecurity    Worry: Not on file    Inability: Not on file  . Transportation needs    Medical: Not on file    Non-medical: Not on file  Tobacco Use  . Smoking status: Never Smoker  . Smokeless tobacco: Never Used  Substance and Sexual Activity  . Alcohol use: Yes    Alcohol/week: 14.0 standard drinks    Types: 14 Glasses of wine per week    Comment: 2 drinks per day  . Drug use: No  .  Sexual activity: Not on file  Lifestyle  . Physical activity    Days per week: Not on file    Minutes per session: Not on file  . Stress: Not on file  Relationships  . Social Herbalist on phone: Not on file    Gets together: Not on file    Attends religious service: Not on file    Active member of club or organization: Not on file    Attends meetings of clubs or organizations: Not on file    Relationship status: Not on file  Other Topics Concern  . Not on file  Social History Narrative  . Not on file    Family History  Problem Relation Age of Onset  . Thyroid disease Mother   . Bladder Cancer Mother   . Breast cancer Maternal Grandmother        in 80's  . Diabetes Father   . Thyroid cancer Sister   . Colon cancer Neg Hx   . Esophageal cancer Neg Hx   . Rectal cancer Neg Hx   . Stomach cancer Neg Hx     Review of Systems   Constitutional: Negative for chills and fever.  Respiratory: Negative for cough, shortness of breath and wheezing.   Cardiovascular: Positive for palpitations (occ). Negative for chest pain and leg swelling.  Gastrointestinal: Negative for abdominal pain, blood in stool, constipation, diarrhea and nausea.       No gerd  Genitourinary: Negative for dysuria and hematuria.  Musculoskeletal: Positive for arthralgias (knees) and back pain (occ lower back with exercise).  Skin: Negative for color change and rash.  Neurological: Positive for light-headedness (postural) and headaches (occ sinus related). Negative for numbness.  Psychiatric/Behavioral: Negative for dysphoric mood. The patient is not nervous/anxious.        Objective:   Vitals:   10/29/18 1050  BP: 120/82  Pulse: 67  Resp: 16  Temp: 98.4 F (36.9 C)  SpO2: 98%   BP Readings from Last 3 Encounters:  10/29/18 120/82  10/08/18 112/74  09/05/18 110/80   Wt Readings from Last 3 Encounters:  10/29/18 122 lb 12.8 oz (55.7 kg)  10/08/18 124 lb (56.2 kg)  09/05/18 127 lb (57.6 kg)   Body mass index is 19.82 kg/m.   Physical Exam    Constitutional: She appears well-developed and well-nourished. No distress.  HENT:  Head: Normocephalic and atraumatic.  Right Ear: External ear normal. Normal ear canal and TM Left Ear: External ear normal.  Normal ear canal and TM Mouth/Throat: Oropharynx is clear and moist.  Eyes: Conjunctivae and EOM are normal.  Neck: Neck supple. No tracheal deviation present. No thyromegaly present.  No carotid bruit  Cardiovascular: Normal rate, regular rhythm and normal heart sounds.  No murmur heard.  No edema. Pulmonary/Chest: Effort normal and breath sounds normal. No respiratory distress. She has no wheezes. She has no rales.  Breast: deferred to Gyn Abdominal: Soft. She exhibits no distension. There is no tenderness.  Lymphadenopathy: She has no cervical adenopathy.  Skin: Skin is warm and  dry. She is not diaphoretic.  Psychiatric: She has a normal mood and affect. Her behavior is normal.     Assessment & Plan:   Physical exam: Screening blood work ordered-will be nonfasting Immunizations   all up-to-date Colonoscopy up-to-date Mammogram   up-to-date Gyn   up-to-date Eye exams  Up to date  Exercise  Biking, walking, also does weights, but less frequently since Covid started Weight  normal BMI Substance abuse   none Sees derm - skin ca on nose 3-4 years ago - [facility]  See Problem List for Assessment and Plan of chronic medical problems.   Follow-up every #1-number 2 years

## 2018-10-29 ENCOUNTER — Encounter: Payer: Self-pay | Admitting: Internal Medicine

## 2018-10-29 ENCOUNTER — Other Ambulatory Visit: Payer: Self-pay

## 2018-10-29 ENCOUNTER — Ambulatory Visit (INDEPENDENT_AMBULATORY_CARE_PROVIDER_SITE_OTHER): Payer: BC Managed Care – PPO | Admitting: Internal Medicine

## 2018-10-29 VITALS — BP 120/82 | HR 67 | Temp 98.4°F | Resp 16 | Ht 66.0 in | Wt 122.8 lb

## 2018-10-29 DIAGNOSIS — Z86007 Personal history of in-situ neoplasm of skin: Secondary | ICD-10-CM | POA: Insufficient documentation

## 2018-10-29 DIAGNOSIS — J309 Allergic rhinitis, unspecified: Secondary | ICD-10-CM | POA: Diagnosis not present

## 2018-10-29 DIAGNOSIS — M170 Bilateral primary osteoarthritis of knee: Secondary | ICD-10-CM | POA: Diagnosis not present

## 2018-10-29 DIAGNOSIS — Z0000 Encounter for general adult medical examination without abnormal findings: Secondary | ICD-10-CM

## 2018-10-29 DIAGNOSIS — Z833 Family history of diabetes mellitus: Secondary | ICD-10-CM | POA: Diagnosis not present

## 2018-10-29 NOTE — Patient Instructions (Signed)
Tests ordered today. Your results will be released to [place] (or called to you) after review.  If any changes need to be made, you will be notified at that same time.  All other Health Maintenance issues reviewed.   All recommended immunizations and age-appropriate screenings are up-to-date or discussed.  No immunization administered today.   Medications reviewed and updated.  Changes include :   none   Please followup in 1-2 year      Health Maintenance, Female Adopting a healthy lifestyle and getting preventive care are important in promoting health and wellness. Ask your health care provider about:  The right schedule for you to have regular tests and exams.  Things you can do on your own to prevent diseases and keep yourself healthy. What should I know about diet, weight, and exercise? Eat a healthy diet   Eat a diet that includes plenty of vegetables, fruits, low-fat dairy products, and lean protein.  Do not eat a lot of foods that are high in solid fats, added sugars, or sodium. Maintain a healthy weight Body mass index (BMI) is used to identify weight problems. It estimates body fat based on height and weight. Your health care provider can help determine your BMI and help you achieve or maintain a healthy weight. Get regular exercise Get regular exercise. This is one of the most important things you can do for your health. Most adults should:  Exercise for at least 150 minutes each week. The exercise should increase your heart rate and make you sweat (moderate-intensity exercise).  Do strengthening exercises at least twice a week. This is in addition to the moderate-intensity exercise.  Spend less time sitting. Even light physical activity can be beneficial. Watch cholesterol and blood lipids Have your blood tested for lipids and cholesterol at 58 years of age, then have this test every 5 years. Have your cholesterol levels checked more often if:  Your lipid or  cholesterol levels are high.  You are older than 58 years of age.  You are at high risk for heart disease. What should I know about cancer screening? Depending on your health history and family history, you may need to have cancer screening at various ages. This may include screening for:  Breast cancer.  Cervical cancer.  Colorectal cancer.  Skin cancer.  Lung cancer. What should I know about heart disease, diabetes, and high blood pressure? Blood pressure and heart disease  High blood pressure causes heart disease and increases the risk of stroke. This is more likely to develop in people who have high blood pressure readings, are of African descent, or are overweight.  Have your blood pressure checked: ? Every 3-5 years if you are 65-26 years of age. ? Every year if you are 40 years old or older. Diabetes Have regular diabetes screenings. This checks your fasting blood sugar level. Have the screening done:  Once every three years after age 56 if you are at a normal weight and have a low risk for diabetes.  More often and at a younger age if you are overweight or have a high risk for diabetes. What should I know about preventing infection? Hepatitis B If you have a higher risk for hepatitis B, you should be screened for this virus. Talk with your health care provider to find out if you are at risk for hepatitis B infection. Hepatitis C Testing is recommended for:  Everyone born from [birth year] through 1965.  Anyone with known risk factors for hepatitis  C. Sexually transmitted infections (STIs)  Get screened for STIs, including gonorrhea and chlamydia, if: ? You are sexually active and are younger than 58 years of age. ? You are older than 58 years of age and your health care provider tells you that you are at risk for this type of infection. ? Your sexual activity has changed since you were last screened, and you are at increased risk for chlamydia or gonorrhea. Ask your  health care provider if you are at risk.  Ask your health care provider about whether you are at high risk for HIV. Your health care provider may recommend a prescription medicine to help prevent HIV infection. If you choose to take medicine to prevent HIV, you should first get tested for HIV. You should then be tested every 3 months for as long as you are taking the medicine. Pregnancy  If you are about to stop having your period (premenopausal) and you may become pregnant, seek counseling before you get pregnant.  Take 400 to 800 micrograms (mcg) of folic acid every day if you become pregnant.  Ask for birth control (contraception) if you want to prevent pregnancy. Osteoporosis and menopause Osteoporosis is a disease in which the bones lose minerals and strength with aging. This can result in bone fractures. If you are 66 years old or older, or if you are at risk for osteoporosis and fractures, ask your health care provider if you should:  Be screened for bone loss.  Take a calcium or vitamin D supplement to lower your risk of fractures.  Be given hormone replacement therapy (HRT) to treat symptoms of menopause. Follow these instructions at home: Lifestyle  Do not use any products that contain nicotine or tobacco, such as cigarettes, e-cigarettes, and chewing tobacco. If you need help quitting, ask your health care provider.  Do not use street drugs.  Do not share needles.  Ask your health care provider for help if you need support or information about quitting drugs. Alcohol use  Do not drink alcohol if: ? Your health care provider tells you not to drink. ? You are pregnant, may be pregnant, or are planning to become pregnant.  If you drink alcohol: ? Limit how much you use to 0-1 drink a day. ? Limit intake if you are breastfeeding.  Be aware of how much alcohol is in your drink. In the [place] one drink equals one 12 oz bottle of beer (355 mL), one 5 oz glass of wine (148  mL), or one 1 oz glass of hard liquor (44 mL). General instructions  Schedule regular health, dental, and eye exams.  Stay current with your vaccines.  Tell your health care provider if: ? You often feel depressed. ? You have ever been abused or do not feel safe at home. Summary  Adopting a healthy lifestyle and getting preventive care are important in promoting health and wellness.  Follow your health care provider's instructions about healthy diet, exercising, and getting tested or screened for diseases.  Follow your health care provider's instructions on monitoring your cholesterol and blood pressure. This information is not intended to replace advice given to you by your health care provider. Make sure you discuss any questions you have with your health care provider. Document Released: 07/11/2010 Document Revised: 12/19/2017 Document Reviewed: 12/19/2017 Elsevier Patient Education  2020 [employer].

## 2018-10-29 NOTE — Assessment & Plan Note (Signed)
Controlled with Claritin and ipratropium daily

## 2018-10-29 NOTE — Assessment & Plan Note (Signed)
Sees dermatology-[facility] annually

## 2018-10-29 NOTE — Assessment & Plan Note (Signed)
Has seen orthopedics and has had Synvisc injections in the right knee Chronic, daily pain Takes Advil or Aleve on a daily basis Exercises regularly

## 2018-10-29 NOTE — Assessment & Plan Note (Signed)
Father had diabetes Has not had a good fasting sugar-we will check fasting sugar and A1c Exercises regularly She does drink wine on a nightly basis, but overall has a very healthy diet

## 2018-10-30 ENCOUNTER — Encounter: Payer: Self-pay | Admitting: Internal Medicine

## 2018-11-01 ENCOUNTER — Encounter: Payer: Self-pay | Admitting: Internal Medicine

## 2018-11-01 ENCOUNTER — Other Ambulatory Visit (INDEPENDENT_AMBULATORY_CARE_PROVIDER_SITE_OTHER): Payer: BC Managed Care – PPO

## 2018-11-01 DIAGNOSIS — Z833 Family history of diabetes mellitus: Secondary | ICD-10-CM | POA: Diagnosis not present

## 2018-11-01 DIAGNOSIS — Z0000 Encounter for general adult medical examination without abnormal findings: Secondary | ICD-10-CM

## 2018-11-01 LAB — COMPREHENSIVE METABOLIC PANEL
ALT: 16 U/L (ref 0–35)
AST: 20 U/L (ref 0–37)
Albumin: 4.1 g/dL (ref 3.5–5.2)
Alkaline Phosphatase: 49 U/L (ref 39–117)
BUN: 15 mg/dL (ref 6–23)
CO2: 28 mEq/L (ref 19–32)
Calcium: 9.1 mg/dL (ref 8.4–10.5)
Chloride: 105 mEq/L (ref 96–112)
Creatinine, Ser: 0.73 mg/dL (ref 0.40–1.20)
GFR: 81.9 mL/min (ref >60.00)
Glucose, Bld: 97 mg/dL (ref 70–99)
Potassium: 4.6 mEq/L (ref 3.5–5.1)
Sodium: 139 mEq/L (ref 135–145)
Total Bilirubin: 0.6 mg/dL (ref 0.2–1.2)
Total Protein: 6.7 g/dL (ref 6.0–8.3)

## 2018-11-01 LAB — CBC WITH DIFFERENTIAL/PLATELET
Basophils Absolute: 0 10*3/uL (ref 0.0–0.1)
Basophils Relative: 1.1 % (ref 0.0–3.0)
Eosinophils Absolute: 0.1 10*3/uL (ref 0.0–0.7)
Eosinophils Relative: 3.5 % (ref 0.0–5.0)
HCT: 40.3 % (ref 36.0–46.0)
Hemoglobin: 13.6 g/dL (ref 12.0–15.0)
Lymphocytes Relative: 59.5 % — ABNORMAL HIGH (ref 12.0–46.0)
Lymphs Abs: 2.4 10*3/uL (ref 0.7–4.0)
MCHC: 33.7 g/dL (ref 30.0–36.0)
MCV: 95.9 fl (ref 78.0–100.0)
Monocytes Absolute: 0.4 10*3/uL (ref 0.1–1.0)
Monocytes Relative: 9.1 % (ref 3.0–12.0)
Neutro Abs: 1.1 10*3/uL — ABNORMAL LOW (ref 1.4–7.7)
Neutrophils Relative %: 26.8 % — ABNORMAL LOW (ref 43.0–77.0)
Platelets: 234 10*3/uL (ref 150.0–400.0)
RBC: 4.2 Mil/uL (ref 3.87–5.11)
RDW: 12.8 % (ref 11.5–15.5)
WBC: 4 10*3/uL (ref 4.0–10.5)

## 2018-11-01 LAB — LIPID PANEL
Cholesterol: 204 mg/dL — ABNORMAL HIGH (ref 0–200)
HDL: 93.3 mg/dL (ref >39.00)
LDL Cholesterol: 99 mg/dL (ref 0–99)
NonHDL: 110.27
Total CHOL/HDL Ratio: 2
Triglycerides: 54 mg/dL (ref 0.0–149.0)
VLDL: 10.8 mg/dL (ref 0.0–40.0)

## 2018-11-01 LAB — HEMOGLOBIN A1C: Hgb A1c MFr Bld: 5.4 % (ref 4.6–6.5)

## 2018-11-01 LAB — TSH: TSH: 3.12 u[IU]/mL (ref 0.35–4.50)

## 2018-11-04 DIAGNOSIS — M25571 Pain in right ankle and joints of right foot: Secondary | ICD-10-CM | POA: Diagnosis not present

## 2018-11-27 ENCOUNTER — Telehealth: Payer: Self-pay | Admitting: Internal Medicine

## 2018-11-27 NOTE — Telephone Encounter (Signed)
ROI fax to [facility] for patient records.

## 2018-12-12 ENCOUNTER — Encounter: Payer: Self-pay | Admitting: Internal Medicine

## 2018-12-30 DIAGNOSIS — D2272 Melanocytic nevi of left lower limb, including hip: Secondary | ICD-10-CM | POA: Diagnosis not present

## 2018-12-30 DIAGNOSIS — Z85828 Personal history of other malignant neoplasm of skin: Secondary | ICD-10-CM | POA: Diagnosis not present

## 2018-12-30 DIAGNOSIS — L723 Sebaceous cyst: Secondary | ICD-10-CM | POA: Diagnosis not present

## 2018-12-30 DIAGNOSIS — L905 Scar conditions and fibrosis of skin: Secondary | ICD-10-CM | POA: Diagnosis not present

## 2019-01-10 HISTORY — PX: TRIGGER FINGER RELEASE: SHX641

## 2019-01-13 ENCOUNTER — Other Ambulatory Visit: Payer: Self-pay | Admitting: Internal Medicine

## 2019-01-13 DIAGNOSIS — Z1231 Encounter for screening mammogram for malignant neoplasm of breast: Secondary | ICD-10-CM

## 2019-02-12 DIAGNOSIS — Z6820 Body mass index (BMI) 20.0-20.9, adult: Secondary | ICD-10-CM | POA: Diagnosis not present

## 2019-02-12 DIAGNOSIS — Z8052 Family history of malignant neoplasm of bladder: Secondary | ICD-10-CM | POA: Diagnosis not present

## 2019-02-12 DIAGNOSIS — Z803 Family history of malignant neoplasm of breast: Secondary | ICD-10-CM | POA: Diagnosis not present

## 2019-02-12 DIAGNOSIS — Z13820 Encounter for screening for osteoporosis: Secondary | ICD-10-CM | POA: Diagnosis not present

## 2019-02-12 DIAGNOSIS — Z01419 Encounter for gynecological examination (general) (routine) without abnormal findings: Secondary | ICD-10-CM | POA: Diagnosis not present

## 2019-02-12 DIAGNOSIS — Z808 Family history of malignant neoplasm of other organs or systems: Secondary | ICD-10-CM | POA: Diagnosis not present

## 2019-02-12 DIAGNOSIS — Z8049 Family history of malignant neoplasm of other genital organs: Secondary | ICD-10-CM | POA: Diagnosis not present

## 2019-02-27 ENCOUNTER — Ambulatory Visit: Payer: BC Managed Care – PPO

## 2019-03-13 ENCOUNTER — Ambulatory Visit: Payer: BC Managed Care – PPO

## 2019-03-16 IMAGING — MG DIGITAL SCREENING BILATERAL MAMMOGRAM WITH TOMO AND CAD
8 series · 9 of 24 positions shown · non-contrast
Comparison: Previous exam(s).

CLINICAL DATA: Screening.

EXAM:
DIGITAL SCREENING BILATERAL MAMMOGRAM WITH TOMO AND CAD

[L CC synth-2D]
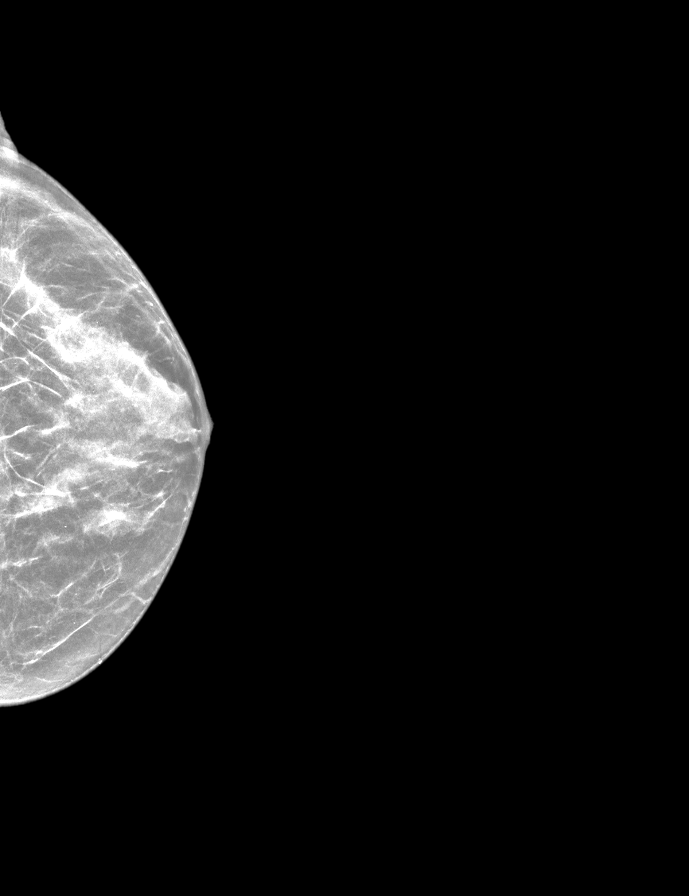

[R CC synth-2D]
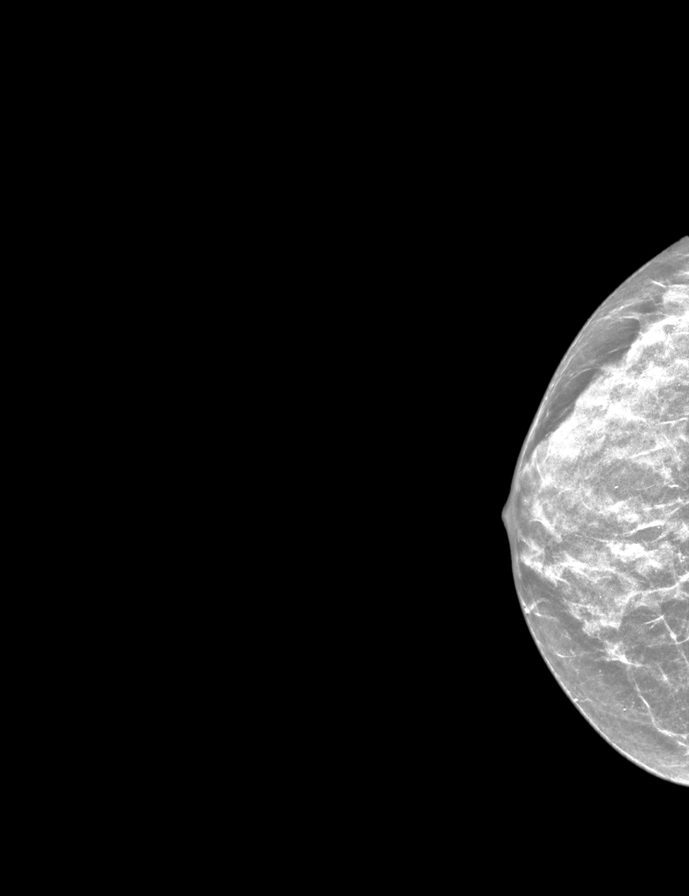

[R MLO synth-2D]
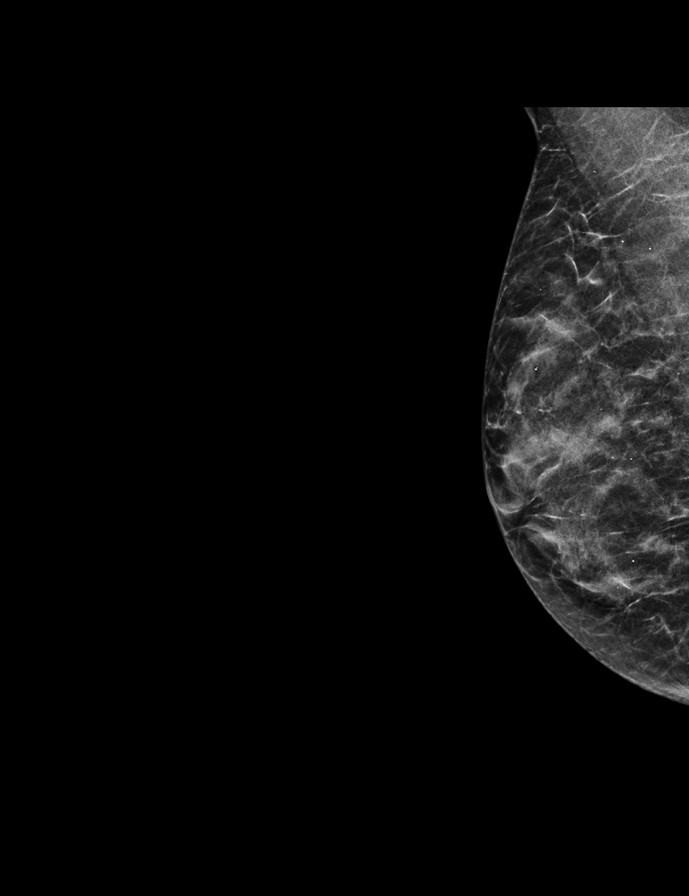

[L MLO synth-2D]
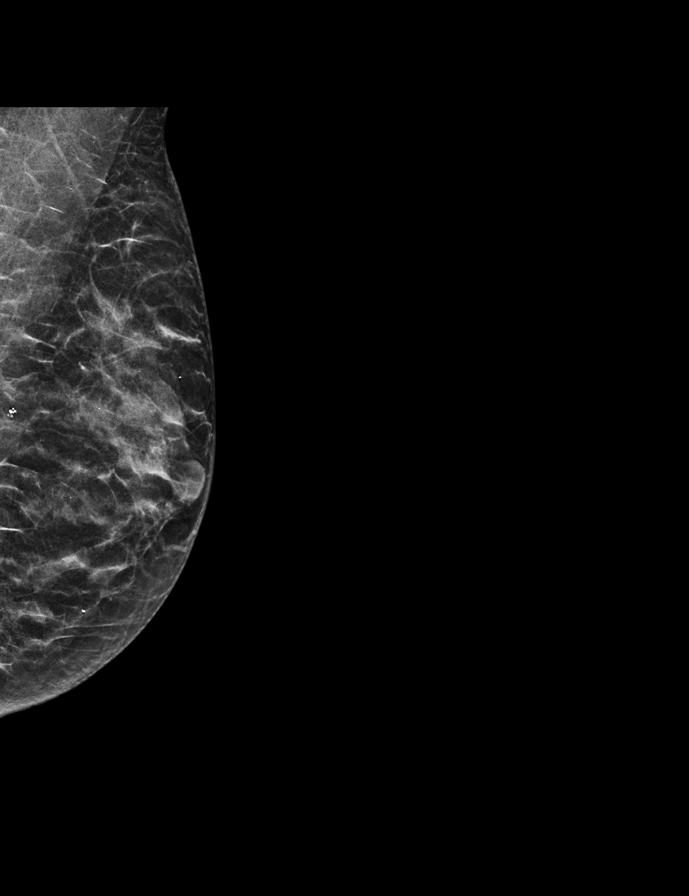

[R MLO tomo · 2 of 37 frames shown]
[frame 13/37]
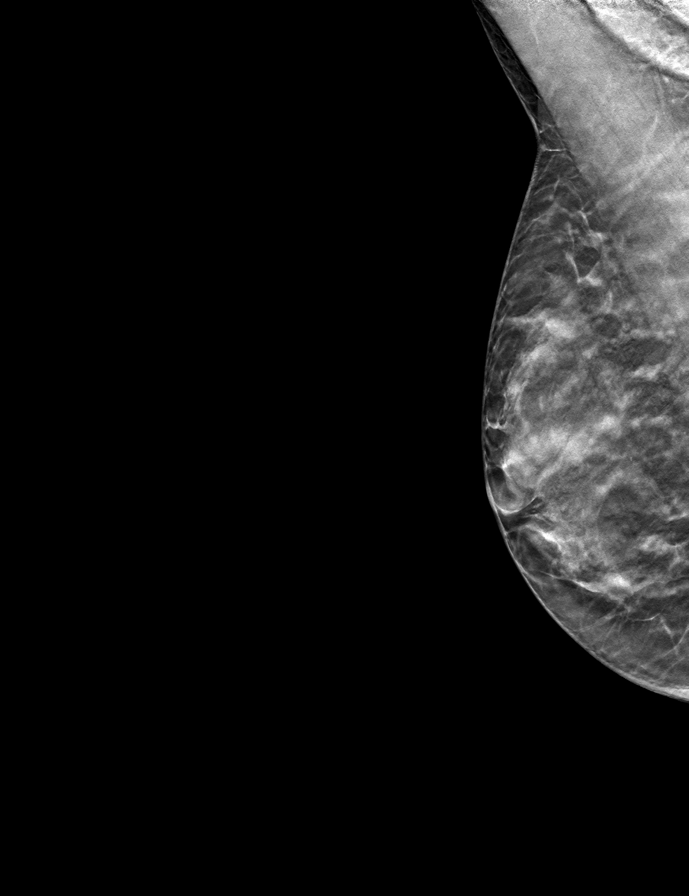
[frame 19/37]
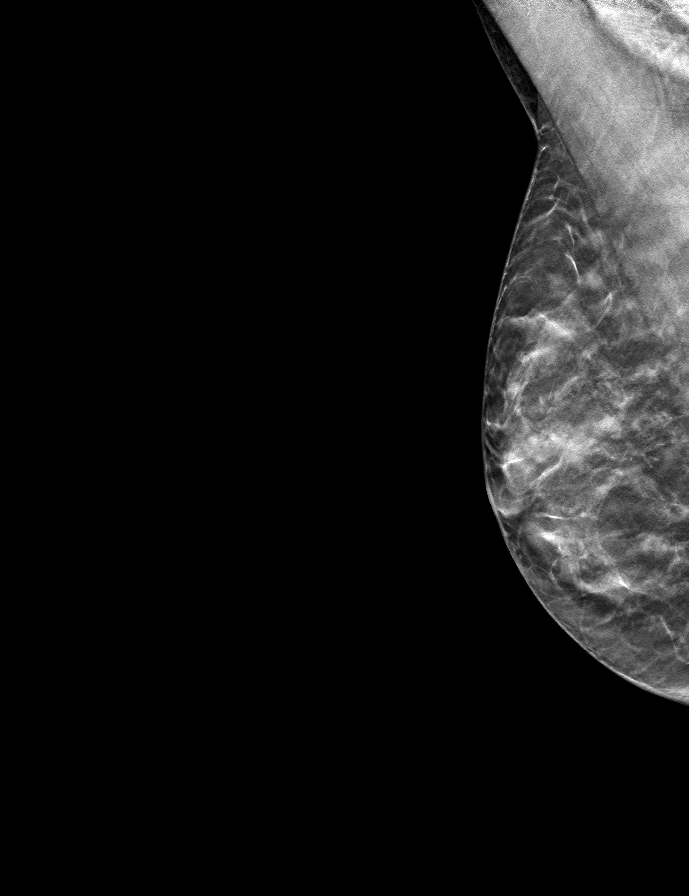

[L MLO tomo · tomo slice 21/40.0]
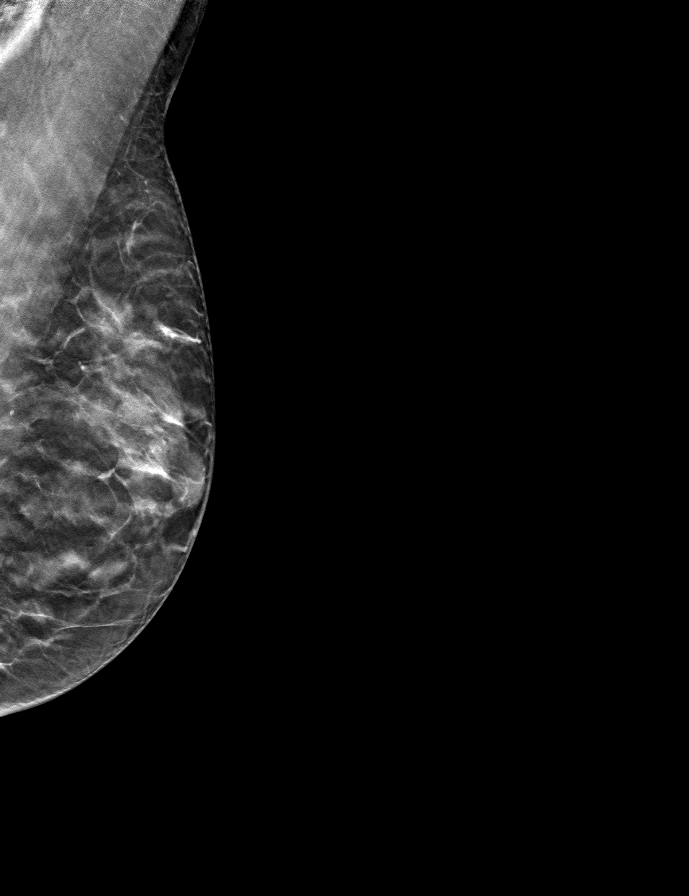

[L CC tomo · tomo slice 22/43.0]
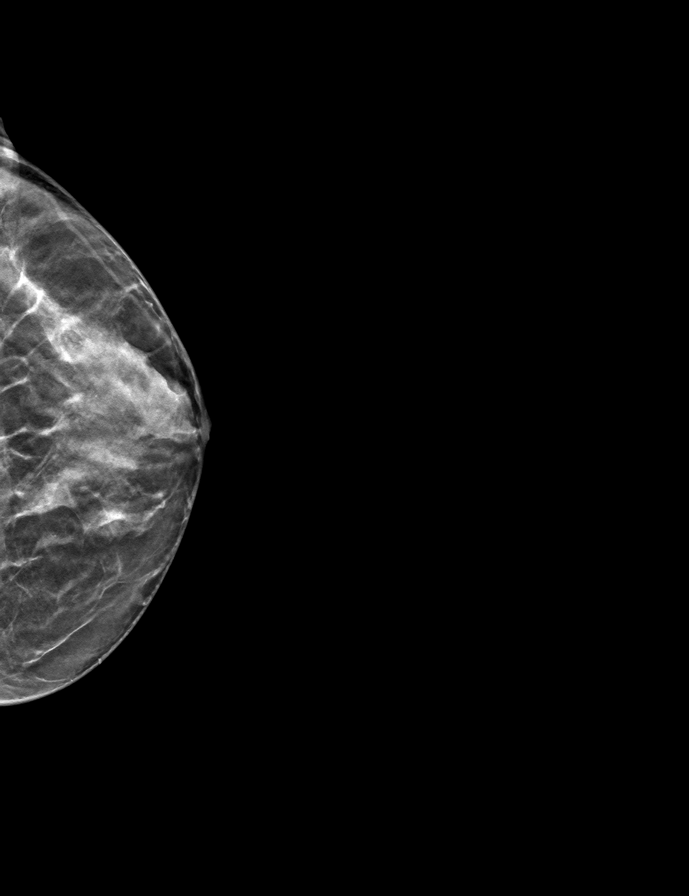

[R CC tomo · tomo slice 19/38.0]
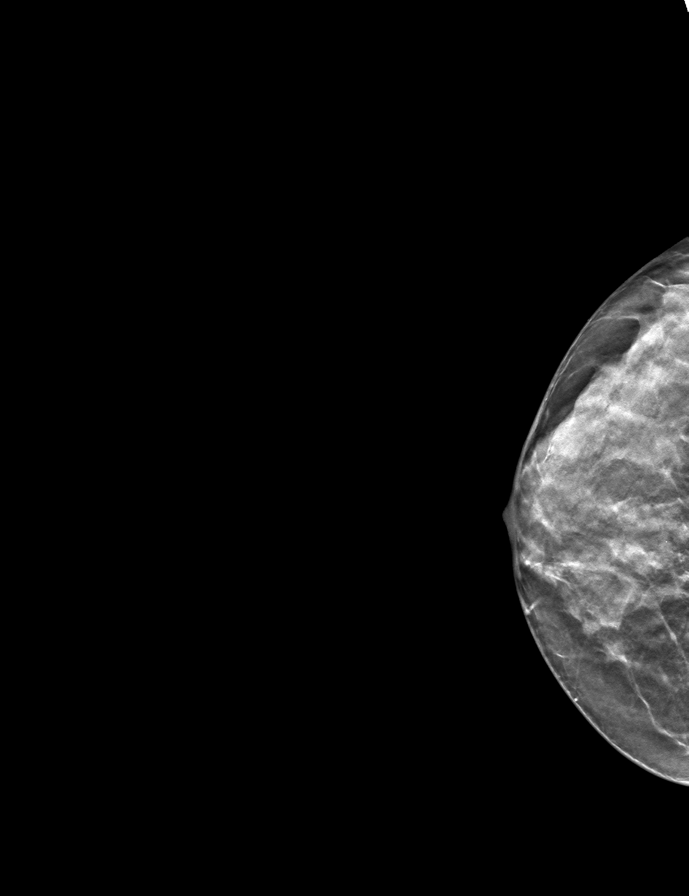

[9 of 24 positions shown; findings below may reference images not displayed]

ACR Breast Density Category c: The breast tissue is heterogeneously
dense, which may obscure small masses.
FINDINGS: There are no findings suspicious for malignancy. Images were
processed with CAD.
IMPRESSION: No mammographic evidence of malignancy. A result letter of this
screening mammogram will be mailed directly to the patient.

RECOMMENDATION:
Screening mammogram in one year. (Code:FT-U-LHB)

BI-RADS CATEGORY  1: Negative.

## 2019-04-30 ENCOUNTER — Other Ambulatory Visit: Payer: Self-pay

## 2019-04-30 ENCOUNTER — Ambulatory Visit
Admission: RE | Admit: 2019-04-30 | Discharge: 2019-04-30 | Disposition: A | Discharge status: Home / Self Care | Payer: BC Managed Care – PPO | Source: Ambulatory Visit | Attending: Internal Medicine | Admitting: Internal Medicine

## 2019-04-30 DIAGNOSIS — Z1231 Encounter for screening mammogram for malignant neoplasm of breast: Secondary | ICD-10-CM

## 2019-07-17 NOTE — Progress Notes (Signed)
Subjective:    Patient ID: Erin Stokes, female    DOB: March 31, 1960, 59 y.o.   MRN: [id number]  HPI The patient is here for an acute visit.   Nail discoloration: Her right ring finger on the lateral aspect is slightly discolored.  There is some dry skin in the skin next to the nail.  She denies any injuries or exposure to chemicals or anything out of the ordinary.  She denies any pain.  The nails not thickened.  She denies any other nail involvement.  She does not get any nail polish or do any treatments on her nails.       Medications and allergies reviewed with patient and updated if appropriate.  Patient Active Problem List   Diagnosis Date Noted  . Family history of diabetes mellitus in father 10/29/2018  . Bilateral primary osteoarthritis of knee 10/29/2018  . History of squamous cell carcinoma in situ of skin 10/29/2018  . Cellulitis 10/08/2018  . Leg hematoma, right, subsequent encounter 09/05/2018  . Finger pain, left 09/05/2018  . De Quervain's tenosynovitis, right 11/22/2016  . Endometrial polyp 07/16/2014  . Allergic rhinitis 05/15/2007    Current Outpatient Medications on File Prior to Visit  Medication Sig Dispense Refill  . Ascorbic Acid (VITAMIN C PO) Take 500 mg by mouth daily.    Marland Kitchen CALCIUM PO Take by mouth. With magnesium    . Cholecalciferol (VITAMIN D-3 PO) Take 1,000 Units by mouth daily.    Marland Kitchen ibuprofen (ADVIL,MOTRIN) 100 MG tablet Take 100 mg by mouth every 6 (six) hours as needed.      Marland Kitchen ipratropium (ATROVENT) 0.03 % nasal spray USE 1 SPRAY IN EACH NOSTRIL THREE TIMES DAILY 30 mL 6  . loratadine (CLARITIN) 10 MG tablet Take 10 mg by mouth daily.      Marland Kitchen MELATONIN PO Take 2 mg by mouth as needed.     . Multiple Vitamins-Minerals (MULTIVITAMIN PO) Take by mouth daily.    . Naproxen Sodium (ALEVE PO) Take by mouth in the morning and at bedtime.     No current facility-administered medications on file prior to visit.    Past Medical History:    Diagnosis Date  . Allergic rhinitis, cause unspecified   . Endometrial polyp   . Family history of ischemic heart disease   . History of basal cell carcinoma excision    nose 2013  . Irregular menstrual cycle   . Wears glasses     Past Surgical History:  Procedure Laterality Date  . basal cell cancer     removed from nose in October 2013  . DENTAL SURGERY     implants x 2  . DILATATION & CURETTAGE/HYSTEROSCOPY WITH MYOSURE N/A 07/16/2014   Procedure: DILATATION & CURETTAGE/HYSTEROSCOPY WITH MYOSURE;  Surgeon: Arvella Nigh, MD;  Location: [facility];  Service: Gynecology;  Laterality: N/A;  . HYMENECTOMY     as a teenager  . WRIST SURGERY Right 03/08/2017    Social History   Socioeconomic History  . Marital status: Divorced    Spouse name: Not on file  . Number of children: Not on file  . Years of education: Not on file  . Highest education level: Not on file  Occupational History  . Not on file  Tobacco Use  . Smoking status: Never Smoker  . Smokeless tobacco: Never Used  Substance and Sexual Activity  . Alcohol use: Yes    Alcohol/week: 14.0 standard drinks    Types:  14 Glasses of wine per week    Comment: 2 drinks per day  . Drug use: No  . Sexual activity: Not on file  Other Topics Concern  . Not on file  Social History Narrative  . Not on file   Social Determinants of Health   Financial Resource Strain:   . Difficulty of Paying Living Expenses:   Food Insecurity:   . Worried About Charity fundraiser in the Last Year:   . Arboriculturist in the Last Year:   Transportation Needs:   . Film/video editor (Medical):   Marland Kitchen Lack of Transportation (Non-Medical):   Physical Activity:   . Days of Exercise per Week:   . Minutes of Exercise per Session:   Stress:   . Feeling of Stress :   Social Connections:   . Frequency of Communication with Friends and Family:   . Frequency of Social Gatherings with Friends and Family:   . Attends  Religious Services:   . Active Member of Clubs or Organizations:   . Attends Archivist Meetings:   Marland Kitchen Marital Status:     Family History  Problem Relation Age of Onset  . Thyroid disease Mother   . Bladder Cancer Mother   . Arthritis Mother   . Breast cancer Maternal Grandmother        in 39's  . Diabetes Father   . Arthritis Father   . Heart disease Father   . Thyroid cancer Sister   . Arthritis Sister   . Colon cancer Neg Hx   . Esophageal cancer Neg Hx   . Rectal cancer Neg Hx   . Stomach cancer Neg Hx     Review of Systems     Objective:   Vitals:   07/18/19 1506  BP: 122/84  Pulse: 73  Temp: 98.9 F (37.2 C)  SpO2: 99%   BP Readings from Last 3 Encounters:  07/18/19 122/84  10/29/18 120/82  10/08/18 112/74   Wt Readings from Last 3 Encounters:  07/18/19 123 lb (55.8 kg)  10/29/18 122 lb 12.8 oz (55.7 kg)  10/08/18 124 lb (56.2 kg)   Body mass index is 19.85 kg/m.   Physical Exam Constitutional:      Appearance: Normal appearance.  Skin:    General: Skin is warm and dry.     Comments: Right ring finger on lateral aspect of nail there is slightly yellow discoloration in the white part of the nail that extends midway down the nail toward the nailbed but not near it.  Skin adjacent to that area is dry and peeling  Neurological:     Mental Status: She is alert.            Assessment & Plan:    See Problem List for Assessment and Plan of chronic medical problems.    This visit occurred during the SARS-CoV-2 public health emergency.  Safety protocols were in place, including screening questions prior to the visit, additional usage of staff PPE, and extensive cleaning of exam room while observing appropriate contact time as indicated for disinfecting solutions.

## 2019-07-18 ENCOUNTER — Other Ambulatory Visit: Payer: Self-pay

## 2019-07-18 ENCOUNTER — Ambulatory Visit (INDEPENDENT_AMBULATORY_CARE_PROVIDER_SITE_OTHER): Payer: BC Managed Care – PPO | Admitting: Internal Medicine

## 2019-07-18 ENCOUNTER — Encounter: Payer: Self-pay | Admitting: Internal Medicine

## 2019-07-18 DIAGNOSIS — B351 Tinea unguium: Secondary | ICD-10-CM | POA: Diagnosis not present

## 2019-07-18 MED ORDER — CICLOPIROX 8 % EX SOLN: Freq: Every day | CUTANEOUS | 2 refills | Status: DC

## 2019-07-18 NOTE — Assessment & Plan Note (Signed)
Acute Right index finger Stent with possible fungal infection Will try ciclopirox daily If there is no improvement advised that she see her dermatologist-Dr. Amy Martinique

## 2019-07-18 NOTE — Patient Instructions (Addendum)
You the topical solution nightly.    Onychomycosis/Fungal Toenails  WHAT IS IT? An infection that lies within the keratin of your nail plate that is caused by a fungus.  WHY ME? Fungal infections affect all ages, sexes, races, and creeds.  There may be many factors that predispose you to a fungal infection such as age, coexisting medical conditions such as diabetes, or an autoimmune disease; stress, medications, fatigue, genetics, etc.  Bottom line: fungus thrives in a warm, moist environment and your shoes offer such a location.  IS IT CONTAGIOUS? Theoretically, yes.  You do not want to share shoes, nail clippers or files with someone who has fungal toenails.  Walking around barefoot in the same room or sleeping in the same bed is unlikely to transfer the organism.  It is important to realize, however, that fungus can spread easily from one nail to the next on the same foot.  HOW DO WE TREAT THIS?  There are several ways to treat this condition.  Treatment may depend on many factors such as age, medications, pregnancy, liver and kidney conditions, etc.  It is best to ask your doctor which options are available to you.  1. No treatment.   Unlike many other medical concerns, you can live with this condition.  However for many people this can be a painful condition and may lead to ingrown toenails or a bacterial infection.  It is recommended that you keep the nails cut short to help reduce the amount of fungal nail. 2. Topical treatment.  These range from herbal remedies to prescription strength nail lacquers.  About 40-50% effective, topicals require twice daily application for approximately 9 to 12 months or until an entirely new nail has grown out.  The most effective topicals are medical grade medications available through physicians offices. 3. Oral antifungal medications.  With an 80-90% cure rate, the most common oral medication requires 3 to 4 months of therapy and stays in your system for a  year as the new nail grows out.  Oral antifungal medications do require blood work to make sure it is a safe drug for you.  A liver function panel will be performed prior to starting the medication and after the first month of treatment.  It is important to have the blood work performed to avoid any harmful side effects.  In general, this medication safe but blood work is required. 4. Laser Therapy.  This treatment is performed by applying a specialized laser to the affected nail plate.  This therapy is noninvasive, fast, and non-painful.  It is not covered by insurance and is therefore, out of pocket.  The results have been very good with a 80-95% cure rate.  [facility] is the only practice in the area to offer this therapy. 5. Permanent Nail Avulsion.  Removing the entire nail so that a new nail will not grow back.

## 2019-08-12 DIAGNOSIS — M65341 Trigger finger, right ring finger: Secondary | ICD-10-CM | POA: Diagnosis not present

## 2019-08-12 DIAGNOSIS — M25531 Pain in right wrist: Secondary | ICD-10-CM | POA: Diagnosis not present

## 2019-09-29 DIAGNOSIS — M65341 Trigger finger, right ring finger: Secondary | ICD-10-CM | POA: Diagnosis not present

## 2019-10-27 DIAGNOSIS — M25531 Pain in right wrist: Secondary | ICD-10-CM | POA: Diagnosis not present

## 2019-10-27 DIAGNOSIS — M65341 Trigger finger, right ring finger: Secondary | ICD-10-CM | POA: Diagnosis not present

## 2019-12-13 ENCOUNTER — Other Ambulatory Visit: Payer: Self-pay | Admitting: Internal Medicine

## 2019-12-14 ENCOUNTER — Other Ambulatory Visit: Payer: Self-pay | Admitting: Internal Medicine

## 2020-02-11 DIAGNOSIS — Z85828 Personal history of other malignant neoplasm of skin: Secondary | ICD-10-CM | POA: Diagnosis not present

## 2020-02-11 DIAGNOSIS — M65341 Trigger finger, right ring finger: Secondary | ICD-10-CM | POA: Diagnosis not present

## 2020-02-11 DIAGNOSIS — D225 Melanocytic nevi of trunk: Secondary | ICD-10-CM | POA: Diagnosis not present

## 2020-02-11 DIAGNOSIS — L718 Other rosacea: Secondary | ICD-10-CM | POA: Diagnosis not present

## 2020-02-11 DIAGNOSIS — L821 Other seborrheic keratosis: Secondary | ICD-10-CM | POA: Diagnosis not present

## 2020-02-16 DIAGNOSIS — Z6820 Body mass index (BMI) 20.0-20.9, adult: Secondary | ICD-10-CM | POA: Diagnosis not present

## 2020-02-16 DIAGNOSIS — Z01419 Encounter for gynecological examination (general) (routine) without abnormal findings: Secondary | ICD-10-CM | POA: Diagnosis not present

## 2020-02-25 DIAGNOSIS — M79641 Pain in right hand: Secondary | ICD-10-CM | POA: Diagnosis not present

## 2020-02-25 DIAGNOSIS — M65341 Trigger finger, right ring finger: Secondary | ICD-10-CM | POA: Diagnosis not present

## 2020-03-01 DIAGNOSIS — H52203 Unspecified astigmatism, bilateral: Secondary | ICD-10-CM | POA: Diagnosis not present

## 2020-03-01 DIAGNOSIS — H524 Presbyopia: Secondary | ICD-10-CM | POA: Diagnosis not present

## 2020-03-01 DIAGNOSIS — H04123 Dry eye syndrome of bilateral lacrimal glands: Secondary | ICD-10-CM | POA: Diagnosis not present

## 2020-03-01 DIAGNOSIS — H2513 Age-related nuclear cataract, bilateral: Secondary | ICD-10-CM | POA: Diagnosis not present

## 2020-03-05 ENCOUNTER — Other Ambulatory Visit: Payer: Self-pay

## 2020-03-07 NOTE — Progress Notes (Signed)
Subjective:    Patient ID: Erin Stokes, female    DOB: 09-05-1960, 60 y.o.   MRN: [id number]  HPI The patient is here for an acute visit.   Always had daily BM after breakfast - formed stool.   Over the past year there has been some mild changes in her stool.  She has had a day or two she will not go to the bathroom.  It has increased and more recently she will have difficulty going and then have a large BM.     Took Senokot a couple of times in the kind that helped, but she preferred to do something more natural.   Typically if she does travel or is at her beach house it is different, which is not new.  Over the past year this is occurring even at home, which is unusual.   She eats a high fiber diet, exerises regualrly and drinks a lot of water.  She denies any new medication, supplements.  There has been no major changes in activity.  There could be some very subtle changes in her diet.  She may have decreased her water slightly.  But nothing is really obvious.  She denies any blood in the stool.  On a couple of occasions with constipation she has had a little bit of blood and discomfort in the anus which she felt like was a mild tear.  She denies any abdominal pain or cramping.  Her appetite is normal and she denies any weight loss.  She takes ibuprofen or Aleve on occasion, which is not often and not new  Medications and allergies reviewed with patient and updated if appropriate.  Patient Active Problem List   Diagnosis Date Noted  . Onychomycosis 07/18/2019  . Family history of diabetes mellitus in father 10/29/2018  . Bilateral primary osteoarthritis of knee 10/29/2018  . History of squamous cell carcinoma in situ of skin 10/29/2018  . Leg hematoma, right, subsequent encounter 09/05/2018  . Finger pain, left 09/05/2018  . De Quervain's tenosynovitis, right 11/22/2016  . Endometrial polyp 07/16/2014  . Allergic rhinitis 05/15/2007    Current Outpatient  Medications on File Prior to Visit  Medication Sig Dispense Refill  . Ascorbic Acid (VITAMIN C PO) Take 500 mg by mouth daily.    Marland Kitchen CALCIUM PO Take by mouth. With magnesium    . Cholecalciferol (VITAMIN D-3 PO) Take 1,000 Units by mouth daily.    Marland Kitchen ibuprofen (ADVIL,MOTRIN) 100 MG tablet Take 100 mg by mouth every 6 (six) hours as needed.    Marland Kitchen ipratropium (ATROVENT) 0.03 % nasal spray USE 1 SPRAY IN EACH NOSTRIL THREE TIMES DAILY 30 mL 6  . ipratropium (ATROVENT) 0.03 % nasal spray USE 1 SPRAY IN EACH NOSTRIL THREE TIMES DAILY 30 mL 6  . loratadine (CLARITIN) 10 MG tablet Take 10 mg by mouth daily.    Marland Kitchen MELATONIN PO Take 2 mg by mouth as needed.     . Multiple Vitamins-Minerals (MULTIVITAMIN PO) Take by mouth daily.    . Naproxen Sodium (ALEVE PO) Take by mouth in the morning and at bedtime.     No current facility-administered medications on file prior to visit.    Past Medical History:  Diagnosis Date  . Allergic rhinitis, cause unspecified   . Endometrial polyp   . Family history of ischemic heart disease   . History of basal cell carcinoma excision    nose 2013  . Irregular menstrual cycle   .  Wears glasses     Past Surgical History:  Procedure Laterality Date  . basal cell cancer     removed from nose in October 2013  . DENTAL SURGERY     implants x 2  . DILATATION & CURETTAGE/HYSTEROSCOPY WITH MYOSURE N/A 07/16/2014   Procedure: DILATATION & CURETTAGE/HYSTEROSCOPY WITH MYOSURE;  Surgeon: Arvella Nigh, MD;  Location: [facility];  Service: Gynecology;  Laterality: N/A;  . HYMENECTOMY     as a teenager  . WRIST SURGERY Right 03/08/2017    Social History   Socioeconomic History  . Marital status: Married    Spouse name: Not on file  . Number of children: Not on file  . Years of education: Not on file  . Highest education level: Not on file  Occupational History  . Not on file  Tobacco Use  . Smoking status: Never Smoker  . Smokeless tobacco: Never  Used  Substance and Sexual Activity  . Alcohol use: Yes    Alcohol/week: 14.0 standard drinks    Types: 14 Glasses of wine per week    Comment: 2 drinks per day  . Drug use: No  . Sexual activity: Not on file  Other Topics Concern  . Not on file  Social History Narrative  . Not on file   Social Determinants of Health   Financial Resource Strain: Not on file  Food Insecurity: Not on file  Transportation Needs: Not on file  Physical Activity: Not on file  Stress: Not on file  Social Connections: Not on file    Family History  Problem Relation Age of Onset  . Thyroid disease Mother   . Bladder Cancer Mother   . Arthritis Mother   . Breast cancer Maternal Grandmother        in 4's  . Diabetes Father   . Arthritis Father   . Heart disease Father   . Thyroid cancer Sister   . Arthritis Sister   . Colon cancer Neg Hx   . Esophageal cancer Neg Hx   . Rectal cancer Neg Hx   . Stomach cancer Neg Hx     Review of Systems  Constitutional: Negative for appetite change and unexpected weight change.  Gastrointestinal: Negative for abdominal pain and blood in stool.       Objective:   Vitals:   03/08/20 1439  BP: 126/78  Pulse: 73  Temp: 98.3 F (36.8 C)  SpO2: 99%   BP Readings from Last 3 Encounters:  03/08/20 126/78  07/18/19 122/84  10/29/18 120/82   Wt Readings from Last 3 Encounters:  03/08/20 125 lb (56.7 kg)  07/18/19 123 lb (55.8 kg)  10/29/18 122 lb 12.8 oz (55.7 kg)   Body mass index is 20.18 kg/m.  Depression screen [facility] 2/9 03/08/2020 10/29/2018 09/25/2017  Decreased Interest 0 0 0  Down, Depressed, Hopeless 0 0 0  PHQ - 2 Score 0 0 0  Altered sleeping 2 - -  Tired, decreased energy 0 - -  Change in appetite 0 - -  Feeling bad or failure about yourself  0 - -  Trouble concentrating 0 - -  Moving slowly or fidgety/restless 0 - -  Suicidal thoughts 0 - -  PHQ-9 Score 2 - -  Difficult doing work/chores Not difficult at all - -       Physical Exam Constitutional:      General: She is not in acute distress.    Appearance: Normal appearance. She is not ill-appearing.  HENT:     Head: Normocephalic and atraumatic.  Abdominal:     General: Abdomen is flat. There is no distension.     Palpations: Abdomen is soft. There is no mass.     Tenderness: There is no abdominal tenderness. There is no guarding or rebound.     Hernia: No hernia is present.  Genitourinary:    Comments: Deferred Skin:    General: Skin is warm and dry.  Neurological:     Mental Status: She is alert.        Last colonoscopy 2014 and repeat due 2024.  Lipoma was removed.  Otherwise her colon was normal except it was mildly tortuous and redundant.  Assessment & Plan:     Screened for depression using the PHQ 9 scale.  No evidence of depression.    See Problem List for Assessment and Plan of chronic medical problems.    This visit occurred during the SARS-CoV-2 public health emergency.  Safety protocols were in place, including screening questions prior to the visit, additional usage of staff PPE, and extensive cleaning of exam room while observing appropriate contact time as indicated for disinfecting solutions.

## 2020-03-08 ENCOUNTER — Ambulatory Visit (INDEPENDENT_AMBULATORY_CARE_PROVIDER_SITE_OTHER): Payer: BC Managed Care – PPO | Admitting: Internal Medicine

## 2020-03-08 ENCOUNTER — Other Ambulatory Visit: Payer: Self-pay

## 2020-03-08 ENCOUNTER — Encounter: Payer: Self-pay | Admitting: Internal Medicine

## 2020-03-08 DIAGNOSIS — K5900 Constipation, unspecified: Secondary | ICD-10-CM | POA: Diagnosis not present

## 2020-03-08 DIAGNOSIS — Z1331 Encounter for screening for depression: Secondary | ICD-10-CM

## 2020-03-08 NOTE — Assessment & Plan Note (Signed)
New problem-going on for the past year Mild in nature and more functional Typically has always gone once daily, formed stool Over the past year has 1-2 days intermittently that she will not go and is mildly constipated Typically eats high-fiber diet, drinks plenty of water and exercise regularly No change in supplements and not on any prescription medication No blood in the stool, abdominal pain, weight loss or other concerning symptoms Last colonoscopy 2014 -she did have a redundant and slightly tortuous colon, which could be contributing, but it is difficult to say why now  At this time I do not think she needs to see GI or have an early colonoscopy unless there is no improvement or she develops any other concerning symptoms  She will continue to eat a high-fiber diet may make some revisions to see if that helps.  She will try increasing her water intake-?  Decreased water intake slightly She will continue regular exercise  If she has not seen any improvement or she develops any other concerning symptoms she will let me know

## 2020-03-08 NOTE — Patient Instructions (Signed)
Let me know if your symptoms do not improve with the changes we discussed.

## 2020-03-16 ENCOUNTER — Other Ambulatory Visit: Payer: Self-pay | Admitting: Internal Medicine

## 2020-03-16 DIAGNOSIS — Z1231 Encounter for screening mammogram for malignant neoplasm of breast: Secondary | ICD-10-CM

## 2020-04-25 ENCOUNTER — Encounter: Payer: Self-pay | Admitting: Internal Medicine

## 2020-05-10 ENCOUNTER — Ambulatory Visit: Payer: BC Managed Care – PPO

## 2020-05-25 ENCOUNTER — Other Ambulatory Visit: Payer: Self-pay

## 2020-05-25 ENCOUNTER — Ambulatory Visit
Admission: RE | Admit: 2020-05-25 | Discharge: 2020-05-25 | Disposition: A | Discharge status: Home / Self Care | Payer: BC Managed Care – PPO | Source: Ambulatory Visit | Attending: Internal Medicine | Admitting: Internal Medicine

## 2020-05-25 DIAGNOSIS — Z1231 Encounter for screening mammogram for malignant neoplasm of breast: Secondary | ICD-10-CM | POA: Diagnosis not present

## 2020-06-29 ENCOUNTER — Ambulatory Visit: Payer: BC Managed Care – PPO

## 2020-08-05 DIAGNOSIS — M542 Cervicalgia: Secondary | ICD-10-CM | POA: Diagnosis not present

## 2020-08-05 DIAGNOSIS — M546 Pain in thoracic spine: Secondary | ICD-10-CM | POA: Diagnosis not present

## 2020-08-05 DIAGNOSIS — M9901 Segmental and somatic dysfunction of cervical region: Secondary | ICD-10-CM | POA: Diagnosis not present

## 2020-08-05 DIAGNOSIS — M25561 Pain in right knee: Secondary | ICD-10-CM | POA: Diagnosis not present

## 2020-08-05 DIAGNOSIS — M25562 Pain in left knee: Secondary | ICD-10-CM | POA: Diagnosis not present

## 2020-08-05 DIAGNOSIS — M9902 Segmental and somatic dysfunction of thoracic region: Secondary | ICD-10-CM | POA: Diagnosis not present

## 2020-09-10 DIAGNOSIS — M9901 Segmental and somatic dysfunction of cervical region: Secondary | ICD-10-CM | POA: Diagnosis not present

## 2020-09-10 DIAGNOSIS — M25562 Pain in left knee: Secondary | ICD-10-CM | POA: Diagnosis not present

## 2020-09-10 DIAGNOSIS — M25561 Pain in right knee: Secondary | ICD-10-CM | POA: Diagnosis not present

## 2020-09-10 DIAGNOSIS — M546 Pain in thoracic spine: Secondary | ICD-10-CM | POA: Diagnosis not present

## 2020-09-10 DIAGNOSIS — M9902 Segmental and somatic dysfunction of thoracic region: Secondary | ICD-10-CM | POA: Diagnosis not present

## 2020-09-10 DIAGNOSIS — M542 Cervicalgia: Secondary | ICD-10-CM | POA: Diagnosis not present

## 2020-09-29 ENCOUNTER — Encounter: Payer: Self-pay | Admitting: Internal Medicine

## 2020-10-11 DIAGNOSIS — M65341 Trigger finger, right ring finger: Secondary | ICD-10-CM | POA: Diagnosis not present

## 2020-10-11 DIAGNOSIS — M65841 Other synovitis and tenosynovitis, right hand: Secondary | ICD-10-CM | POA: Diagnosis not present

## 2020-10-25 DIAGNOSIS — M65341 Trigger finger, right ring finger: Secondary | ICD-10-CM | POA: Diagnosis not present

## 2020-11-09 DIAGNOSIS — M65341 Trigger finger, right ring finger: Secondary | ICD-10-CM | POA: Diagnosis not present

## 2020-11-23 DIAGNOSIS — M65341 Trigger finger, right ring finger: Secondary | ICD-10-CM | POA: Diagnosis not present

## 2021-01-28 ENCOUNTER — Other Ambulatory Visit: Payer: Self-pay | Admitting: Internal Medicine

## 2021-02-07 ENCOUNTER — Encounter: Payer: Self-pay | Admitting: Internal Medicine

## 2021-02-16 DIAGNOSIS — Z1151 Encounter for screening for human papillomavirus (HPV): Secondary | ICD-10-CM | POA: Diagnosis not present

## 2021-02-16 DIAGNOSIS — Z6820 Body mass index (BMI) 20.0-20.9, adult: Secondary | ICD-10-CM | POA: Diagnosis not present

## 2021-02-16 DIAGNOSIS — Z124 Encounter for screening for malignant neoplasm of cervix: Secondary | ICD-10-CM | POA: Diagnosis not present

## 2021-02-16 DIAGNOSIS — Z01419 Encounter for gynecological examination (general) (routine) without abnormal findings: Secondary | ICD-10-CM | POA: Diagnosis not present

## 2021-02-16 DIAGNOSIS — Z13820 Encounter for screening for osteoporosis: Secondary | ICD-10-CM | POA: Diagnosis not present

## 2021-02-16 NOTE — Progress Notes (Signed)
Subjective:    Patient ID: Erin Stokes, female    DOB: 1960-05-23, 61 y.o.   MRN: [id number]  This visit occurred during the SARS-CoV-2 public health emergency.  Safety protocols were in place, including screening questions prior to the visit, additional usage of staff PPE, and extensive cleaning of exam room while observing appropriate contact time as indicated for disinfecting solutions.    HPI The patient is here for an acute visit.  Always had seasonal allergies - all year round.   This fall/winter allergies have been much worse -over the past couple of years it has been a chronic progressive worsening and her allergies have not been controlled.  She has constant nose running,  itchy and dryness and sneezing.  She has had allergy testing years ago and was allergic to several trees, grasses etc.  For years she has been taking Claritin daily and using ipratropium nasal spray and initially that worked well, but has not worked for a long time.  She has also noticed over the past few months straining of her voice when talking and harder to get good volume-she thinks that has gotten better recently.  Allergies seem to be worse in her house here, better at her beach house.   Her fingertips turn white at times when she is cold-she wondered if that was Raynaud's.  She had wondered about getting a CT coronary calcium score-her gynecologist mentioned it.    Medications and allergies reviewed with patient and updated if appropriate.  Patient Active Problem List   Diagnosis Date Noted   Raynaud phenomenon 02/17/2021   Constipation 03/08/2020   Onychomycosis 07/18/2019   Family history of diabetes mellitus in father 10/29/2018   Bilateral primary osteoarthritis of knee 10/29/2018   History of squamous cell carcinoma in situ of skin 10/29/2018   Leg hematoma, right, subsequent encounter 09/05/2018   Finger pain, left 09/05/2018   De Quervain's tenosynovitis, right 11/22/2016    Endometrial polyp 07/16/2014   Allergic rhinitis 05/15/2007    Current Outpatient Medications on File Prior to Visit  Medication Sig Dispense Refill   Ascorbic Acid (VITAMIN C PO) Take 500 mg by mouth daily.     CALCIUM PO Take by mouth. With magnesium     Cholecalciferol (VITAMIN D-3 PO) Take 1,000 Units by mouth daily.     ibuprofen (ADVIL,MOTRIN) 100 MG tablet Take 100 mg by mouth every 6 (six) hours as needed.     ipratropium (ATROVENT) 0.03 % nasal spray USE 1 SPRAY IN EACH NOSTRIL THREE TIMES DAILY 30 mL 6   ipratropium (ATROVENT) 0.03 % nasal spray USE 1 SPRAY IN EACH NOSTRIL THREE TIMES DAILY 30 mL 6   loratadine (CLARITIN) 10 MG tablet Take 10 mg by mouth daily.     MELATONIN PO Take 2 mg by mouth as needed.      Multiple Vitamins-Minerals (MULTIVITAMIN PO) Take by mouth daily.     Naproxen Sodium (ALEVE PO) Take by mouth in the morning and at bedtime.     No current facility-administered medications on file prior to visit.    Past Medical History:  Diagnosis Date   Allergic rhinitis, cause unspecified    Endometrial polyp    Family history of ischemic heart disease    History of basal cell carcinoma excision    nose 2013   Irregular menstrual cycle    Wears glasses     Past Surgical History:  Procedure Laterality Date   basal cell cancer  removed from nose in October 2013   DENTAL SURGERY     implants x 2   DILATATION & CURETTAGE/HYSTEROSCOPY WITH MYOSURE N/A 07/16/2014   Procedure: DILATATION & CURETTAGE/HYSTEROSCOPY WITH MYOSURE;  Surgeon: Arvella Nigh, MD;  Location: [facility];  Service: Gynecology;  Laterality: N/A;   HYMENECTOMY     as a teenager   WRIST SURGERY Right 03/08/2017    Social History   Socioeconomic History   Marital status: Married    Spouse name: Not on file   Number of children: Not on file   Years of education: Not on file   Highest education level: Not on file  Occupational History   Not on file  Tobacco Use    Smoking status: Never   Smokeless tobacco: Never  Substance and Sexual Activity   Alcohol use: Yes    Alcohol/week: 14.0 standard drinks    Types: 14 Glasses of wine per week    Comment: 2 drinks per day   Drug use: No   Sexual activity: Not on file  Other Topics Concern   Not on file  Social History Narrative   Not on file   Social Determinants of Health   Financial Resource Strain: Not on file  Food Insecurity: Not on file  Transportation Needs: Not on file  Physical Activity: Not on file  Stress: Not on file  Social Connections: Not on file    Family History  Problem Relation Age of Onset   Thyroid disease Mother    Bladder Cancer Mother    Arthritis Mother    Breast cancer Maternal Grandmother        in 72's   Diabetes Father    Arthritis Father    Heart disease Father    Thyroid cancer Sister    Arthritis Sister    Colon cancer Neg Hx    Esophageal cancer Neg Hx    Rectal cancer Neg Hx    Stomach cancer Neg Hx     Review of Systems  Constitutional:  Negative for fever.  HENT:  Positive for postnasal drip and rhinorrhea. Negative for congestion, ear pain and sinus pressure.   Respiratory:  Negative for cough, shortness of breath and wheezing.   Neurological:  Negative for headaches.      Objective:   Vitals:   02/17/21 1025  BP: 102/68  Pulse: (!) 58  Temp: 98.1 F (36.7 C)  SpO2: 98%   BP Readings from Last 3 Encounters:  02/17/21 102/68  03/08/20 126/78  07/18/19 122/84   Wt Readings from Last 3 Encounters:  02/17/21 119 lb 12.8 oz (54.3 kg)  03/08/20 125 lb (56.7 kg)  07/18/19 123 lb (55.8 kg)   Body mass index is 19.34 kg/m.   Physical Exam    GENERAL APPEARANCE: Appears stated age, well appearing, NAD EYES: conjunctiva clear, no icterus HENT: bilateral tympanic membranes and ear canals normal, oropharynx with no erythema or exudates, trachea midline, no cervical or supraclavicular lymphadenopathy LUNGS: Unlabored breathing, good  air entry bilaterally, clear to auscultation without wheeze or crackles CARDIOVASCULAR: Normal S1,S2 , no edema SKIN: Warm, dry      Assessment & Plan:    See Problem List for Assessment and Plan of chronic medical problems.

## 2021-02-17 ENCOUNTER — Ambulatory Visit: Payer: BC Managed Care – PPO | Admitting: Internal Medicine

## 2021-02-17 ENCOUNTER — Encounter: Payer: Self-pay | Admitting: Internal Medicine

## 2021-02-17 ENCOUNTER — Other Ambulatory Visit: Payer: Self-pay

## 2021-02-17 VITALS — BP 102/68 | HR 58 | Temp 98.1°F | Ht 66.0 in | Wt 119.8 lb

## 2021-02-17 DIAGNOSIS — I7300 Raynaud's syndrome without gangrene: Secondary | ICD-10-CM

## 2021-02-17 DIAGNOSIS — Z136 Encounter for screening for cardiovascular disorders: Secondary | ICD-10-CM

## 2021-02-17 DIAGNOSIS — J309 Allergic rhinitis, unspecified: Secondary | ICD-10-CM

## 2021-02-17 NOTE — Assessment & Plan Note (Signed)
Symptoms she is explaining are consistent with Raynaud's phenomenon Discussed etiology and symptomatic treatment

## 2021-02-17 NOTE — Assessment & Plan Note (Signed)
Chronic Had allergy testing years ago and has several outdoor allergies-year-round symptoms For years Claritin and ipratropium nasal spray were effective, but over the past few years and especially the past few months they have not been effective and her symptoms are constant She has constant runny nose, irritation of the nose and dryness, sneezing and at times feels like her vocal cords are irritated Stop Claritin and ipratropium nasal spray Trial of Zyrtec-take at night Can add Flonase and to see if that helps Discussed Astelin nasal spray, Singulair as other options that may help Will refer to allergy

## 2021-02-17 NOTE — Patient Instructions (Addendum)
° ° °  Medications changes include :   try switching to zyrtec or xyzal at night - stop the claritin.  You can try flonase nasal spray.         A referral for allergy.

## 2021-02-22 NOTE — Progress Notes (Signed)
NEW PATIENT Date of Service/Encounter:  02/23/21 Referring provider: Binnie Rail, MD Primary care provider: Binnie Rail, MD  Subjective:  Erin Stokes is a 61 y.o. female with a PMHx of history of squamous cell carcinoma of skin, Raynaud's phenomenon, constipation, De Quervain's tenosynovitis presenting today for evaluation of chronic rhinitis. History obtained from: chart review and patient.   Chronic rhinitis: started over 30 years ago; this past winter has really worsened Symptoms include: rhinorrhea, post nasal drainage, sneezing, and itchy nose, voice changes, constant running and dripping down her throat Occurs year-round Potential triggers: trees, grasses, dust mites, molds, cats, feathers Treatments tried: Claritin previously, switched to zyrtec which didn't help, flonase made her feel spacey so she was unable to take it, also made her have appetite loss, ipratropium bromide-stopped Irritants (strong smells) do bother her some Balsamic vinegarette makes her nose run as well. Previous allergy testing:  Yes-years ago, positive to trees and grasses per recollection  at least 20 years ago Allergy shots when she lived in [place] and was unable to continue because she was developing wheezing; did not previously have asthma History of reflux/heartburn: no She was getting sinus infection pretty much every Spring until adding ipatropium.  Was using ipatropium about once a day but stopped using.    Other allergy screening: Asthma: no Food allergy: no Medication allergy:  Rash with Celebrex but can tolerate tylenol and ibuprofen Hymenoptera allergy: no Urticaria: no Eczema:no History of recurrent infections suggestive of immunodeficency: no Vaccinations are up to date.   Past Medical History: Past Medical History:  Diagnosis Date   Allergic rhinitis, cause unspecified    Endometrial polyp    Family history of ischemic heart disease    History of basal cell carcinoma  excision    nose 2013   Irregular menstrual cycle    Wears glasses    Medication List:  Current Outpatient Medications  Medication Sig Dispense Refill   Ascorbic Acid (VITAMIN C PO) Take 500 mg by mouth daily.     CALCIUM PO Take by mouth. With magnesium     cetirizine (ZYRTEC) 10 MG tablet Take 10 mg by mouth daily.     Cholecalciferol (VITAMIN D-3 PO) Take 1,000 Units by mouth daily.     ibuprofen (ADVIL,MOTRIN) 100 MG tablet Take 100 mg by mouth every 6 (six) hours as needed.     ipratropium (ATROVENT) 0.03 % nasal spray USE 1 SPRAY IN EACH NOSTRIL THREE TIMES DAILY 30 mL 6   MELATONIN PO Take 2 mg by mouth as needed.      Multiple Vitamins-Minerals (MULTIVITAMIN PO) Take by mouth daily.     Naproxen Sodium (ALEVE PO) Take by mouth in the morning and at bedtime.     loratadine (CLARITIN) 10 MG tablet Take 10 mg by mouth daily. (Patient not taking: Reported on 02/23/2021)     No current facility-administered medications for this visit.   Known Allergies:  Allergies  Allergen Reactions   Cortisone     Cortisone inj caused atrophy, skin deterioration   Celebrex [Celecoxib] Rash    Able to take advil ,ibuprofen without problems   Past Surgical History: Past Surgical History:  Procedure Laterality Date   basal cell cancer     removed from nose in October 2013   DENTAL SURGERY     implants x 2   DILATATION & CURETTAGE/HYSTEROSCOPY WITH MYOSURE N/A 07/16/2014   Procedure: [place];  Surgeon: Arvella Nigh, MD;  Location:  [facility];  Service: Gynecology;  Laterality: N/A;   HYMENECTOMY     as a teenager   WRIST SURGERY Right 03/08/2017   Family History: Family History  Problem Relation Age of Onset   Thyroid disease Mother    Bladder Cancer Mother    Arthritis Mother    Allergic rhinitis Father    Diabetes Father    Arthritis Father    Heart disease Father    Thyroid cancer Sister    Arthritis Sister    Breast cancer  Maternal Grandmother        in 45's   Food Allergy Son    Allergic rhinitis Son    Colon cancer Neg Hx    Esophageal cancer Neg Hx    Rectal cancer Neg Hx    Stomach cancer Neg Hx    Social History: Erin Stokes lives in a house built 30 years ago, no water damage, + carpet, central AC, no pets, no cockroaches, using DM protection on bedding not pillows, no smoke exposure, [place] interstate[place].   ROS:  All other systems negative except as noted per HPI.  Objective:  Blood pressure 124/80, pulse 83, temperature 98.5 F (36.9 C), temperature source Temporal, resp. rate 16, height 5\' 5"  (1.651 m), weight 120 lb 9.6 oz (54.7 kg), last menstrual period 08/06/2012, SpO2 99 %. Body mass index is 20.07 kg/m. Physical Exam:  General Appearance:  Alert, cooperative, no distress, appears stated age  Head:  Normocephalic, without obvious abnormality, atraumatic  Eyes:  Conjunctiva clear, EOM's intact  Nose: Nares normal with erythematous skin at base of nostrils bilaterally,  mucosa irritated without bleeding or prominent vessels, hypertrophic turbinates, no visible anterior polyps, and septum midline  Throat: Lips, tongue normal; teeth and gums normal, normal posterior oropharynx and + cobblestoning  Neck: Supple, symmetrical  Lungs:   clear to auscultation bilaterally, Respirations unlabored, no coughing  Heart:  regular rate and rhythm and no murmur, Appears well perfused  Extremities: No edema  Skin: Skin color, texture, turgor normal, no rashes or lesions on visualized portions of skin  Neurologic: No gross deficits     Diagnostics: Skin Testing: Deferred due to recent antihistamines use.  Assessment and Plan  Rhinitis - discussed multiple possible etiologies including allergic vs nonallergic (irritant, vasomotor, etc).  Plan as below Will update allergy testing to help better guide treatment options.   Patient Instructions  Chronic Rhinitis: - allergy testing today was  deferred due to recent antihistamine use - continue zyrtec 10 mg daily - continue atrovent (ipatropium bromide) nasal spray 0.03% can use 1-2 sprays up to three times daily, titrate based on symptoms/mucosal dryness - if not controlled, switch from zytec to Xyzal (levocetirizine) 5 mg daily - if not controlled add Nasacort (triamcinolone) or Flonase Sensimist 1-2 sprays daily; if side effects recur, discontinue and we will not try steroid nasal sprays again - following allergy testing, can give more specifics about allergen avoidance. - consider allergy shots on slowest schedule given previous intolerance  History of rash following celebrex (celecoxib):  -avoidance of COX2 specific NSAID inhibitors - okay to use other NSAIDs   Follow-up for environmental allergy testing.  It was a pleasure meeting you in clinic today!  Sigurd Sos, MD [facility]    This note in its entirety was forwarded to the Provider who requested this consultation.  Thank you for your kind referral. I appreciate the opportunity to take part in Ellana's care. Please  do not hesitate to contact me with questions.  Sincerely,  Sigurd Sos, MD [facility]

## 2021-02-23 ENCOUNTER — Ambulatory Visit (INDEPENDENT_AMBULATORY_CARE_PROVIDER_SITE_OTHER): Payer: BC Managed Care – PPO | Admitting: Internal Medicine

## 2021-02-23 ENCOUNTER — Encounter: Payer: Self-pay | Admitting: Internal Medicine

## 2021-02-23 ENCOUNTER — Other Ambulatory Visit: Payer: Self-pay

## 2021-02-23 VITALS — BP 124/80 | HR 83 | Temp 98.5°F | Resp 16 | Ht 65.0 in | Wt 120.6 lb

## 2021-02-23 DIAGNOSIS — Z889 Allergy status to unspecified drugs, medicaments and biological substances status: Secondary | ICD-10-CM | POA: Diagnosis not present

## 2021-02-23 DIAGNOSIS — J310 Chronic rhinitis: Secondary | ICD-10-CM | POA: Diagnosis not present

## 2021-02-23 NOTE — Patient Instructions (Addendum)
Chronic Rhinitis: - allergy testing today was deferred due to recent antihistamine use - continue zyrtec 10 mg daily - continue atrovent (ipatropium bromide) nasal spray 0.03% can use 1-2 sprays up to three times daily, titrate based on symptoms/mucosal dryness - if not controlled, switch from zytec to Xyzal (levocetirizine) 5 mg daily - if not controlled add Nasacort (triamcinolone) or Flonase Sensimist 1-2 sprays daily; if side effects recur, discontinue and we will not try steroid nasal sprays again - following allergy testing, can give more specifics about allergen avoidance. - consider allergy shots on slowest schedule given previous intolerance  History of rash following celebrex (celecoxib):  -avoidance of COX2 specific NSAID inhibitors - okay to use other NSAIDs   Follow-up for environmental allergy testing.  It was a pleasure meeting you in clinic today!  Sigurd Sos, MD [facility]

## 2021-02-24 NOTE — Telephone Encounter (Signed)
I called patient about appointment and wanted to know about the atrovent has antih. In it. [phone]

## 2021-02-24 NOTE — Telephone Encounter (Signed)
It does not.  She can continue that!

## 2021-03-01 NOTE — Progress Notes (Signed)
FOLLOW UP Date of Service/Encounter:  03/02/21   Subjective:  Erin Stokes (DOB: 03-06-1960) is a 61 y.o. female PMHx of history of squamous cell carcinoma of skin, Raynaud's phenomenon, constipation, De Quervain's tenosynovitis who returns to the [facility] on 03/02/2021 in re-evaluation of the following: chronic rhinitis History obtained from: chart review and patient.  For Review, LV was on 02/23/21  with Dr.Zahniya Zellars.  History of allergic rhinitis on shots 20 years in [place] but unable to continue due to development of wheezing.  Unable to tolerate flonase due to it making her feel spacey.  Does respond well to atrovent nasal spray.  On antihistamines at last visit, unable to complete allergy testing.  Today presents for allergy testing. She has been using her atrovent nasal spray and zyrtec as discussed at last clinic visit.  She is doing well since last visit and feels the atrovent is helping a lot.   She has not taken any antihistamines in the last 3 days.   She recalls having issues with allergy injections over 20 years ago because  of the cat allergen.  She does not have a cat and is avoiding cats.  Wonders if we could do injections without cat allergen.    Allergies as of 03/02/2021       Reactions   Cortisone    Cortisone inj caused atrophy, skin deterioration   Celebrex [celecoxib] Rash   Able to take advil ,ibuprofen without problems        Medication List        Accurate as of March 02, 2021  1:25 PM. If you have any questions, ask your nurse or doctor.          ALEVE PO Take by mouth in the morning and at bedtime.   CALCIUM PO Take by mouth. With magnesium   cetirizine 10 MG tablet Commonly known as: ZYRTEC Take 10 mg by mouth daily.   ibuprofen 100 MG tablet Commonly known as: ADVIL Take 100 mg by mouth every 6 (six) hours as needed.   ipratropium 0.03 % nasal spray Commonly known as: ATROVENT USE 1 SPRAY IN EACH NOSTRIL THREE  TIMES DAILY   loratadine 10 MG tablet Commonly known as: CLARITIN Take 10 mg by mouth daily.   MELATONIN PO Take 2 mg by mouth as needed.   MULTIVITAMIN PO Take by mouth daily.   VITAMIN C PO Take 500 mg by mouth daily.   VITAMIN D-3 PO Take 1,000 Units by mouth daily.       Past Medical History:  Diagnosis Date   Allergic rhinitis, cause unspecified    Endometrial polyp    Family history of ischemic heart disease    History of basal cell carcinoma excision    nose 2013   Irregular menstrual cycle    Wears glasses    Past Surgical History:  Procedure Laterality Date   basal cell cancer     removed from nose in October 2013   DENTAL SURGERY     implants x 2   DILATATION & CURETTAGE/HYSTEROSCOPY WITH MYOSURE N/A 07/16/2014   Procedure: DILATATION & CURETTAGE/HYSTEROSCOPY WITH MYOSURE;  Surgeon: Arvella Nigh, MD;  Location: [facility];  Service: Gynecology;  Laterality: N/A;   HYMENECTOMY     as a teenager   WRIST SURGERY Right 03/08/2017   Otherwise, there have been no changes to her past medical history, surgical history, family history, or social history.  ROS: All others negative except  as noted per HPI.   Objective:  BP 122/80    Pulse 89    Temp 98.4 F (36.9 C) (Temporal)    Resp 12    LMP 08/06/2012    SpO2 100%  There is no height or weight on file to calculate BMI. Physical Exam: General Appearance:  Alert, cooperative, no distress, appears stated age  Head:  Normocephalic, without obvious abnormality, atraumatic  Eyes:  Conjunctiva clear, EOM's intact  Nose: Nares normal, hypertrophic turbinates, normal mucosa, no visible anterior polyps, and septum midline  Throat: Lips, tongue normal; teeth and gums normal, normal posterior oropharynx  Neck: Supple, symmetrical  Lungs:   clear to auscultation bilaterally, Respirations unlabored, no coughing  Heart:  regular rate and rhythm and no murmur, Appears well perfused  Extremities: No edema   Skin: Skin color, texture, turgor normal, no rashes or lesions on visualized portions of skin  Neurologic: No gross deficits  Skin Testing: Environmental allergy panel. Adequate positive and negative controls. Results discussed with patient/family.  Airborne Adult Perc - 03/02/21 0846     Time Antigen Placed 2353    Allergen Manufacturer Lavella Hammock    Location Back    Number of Test 59    1. Control-Buffer 50% Glycerol Negative    2. Control-Histamine 1 mg/ml 3+    3. Albumin saline Negative    4. [place] Negative    5. [place] Negative    6. Johnson Negative    7. Icehouse [place] Blue Negative    8. Meadow Fescue Negative    9. Perennial Rye 3+    10. Sweet Vernal 3+    11. Timothy Negative    12. Cocklebur Negative    13. Burweed Marshelder Negative    14. Ragweed, short Negative    15. Ragweed, Giant Negative    16. Plantain,  English Negative    17. Lamb's Quarters Negative    18. Sheep Sorrell 2+    19. Rough Pigweed Negative    20. Marsh Elder, Rough Negative    21. Mugwort, Common Negative    22. Ash mix Negative    23. Birch mix Negative    24. Beech American Negative    25. Box, Elder Negative    26. Cedar, red Negative    27. Cottonwood, [place] Negative    28. Elm mix Negative    29. Hickory Negative    30. Maple mix Negative    31. Oak, [place] mix Negative    32. Pecan Pollen Negative    33. Pine mix Negative    34. Sycamore Eastern Negative    35. [place], Black Pollen Negative    36. Alternaria alternata Negative    37. Cladosporium Herbarum Negative    38. Aspergillus mix Negative    39. Penicillium mix Negative    40. Bipolaris sorokiniana (Helminthosporium) Negative    41. Drechslera spicifera (Curvularia) Negative    42. Mucor plumbeus Negative    43. Fusarium moniliforme Negative    44. Aureobasidium pullulans (pullulara) Negative    45. Rhizopus oryzae Negative    46. Botrytis cinera Negative    47. Epicoccum nigrum 2+    48. Phoma betae Negative     49. Candida Albicans Negative    50. Trichophyton mentagrophytes Negative    51. Mite, D Farinae  5,000 AU/ml Negative    52. Mite, D Pteronyssinus  5,000 AU/ml Negative    53. Cat Hair 10,000 BAU/ml 4+    54.  Dog Epithelia  Negative    55. Mixed Feathers Negative    56. Horse Epithelia Negative    57. Cockroach, German Negative    58. Mouse Negative    59. Tobacco Leaf Negative             Intradermal - 03/02/21 4235     Time Antigen Placed 3614    Allergen Manufacturer Lavella Hammock    Location Arm    Number of Test 8    Control Negative    Ragweed mix Negative    Mold 1 Negative    Mold 2 Negative    Mold 3 Negative    Mold 4 Negative    Dog Negative    Mite mix 4+             Allergy testing results were read and interpreted by myself, documented by clinical staff.  Assessment/Plan  History and testing consistent with seasonal and perennial allergic rhinitis. We can consider allergy shots, but would do very slow build-up given history of intolerance due to wheezing.  It has been > 20 years since she last tried, so she may not be as reactive at this point.   She could do injections without cat, but since this seems to be a major trigger even though she is not around cats by choice, she will encounter cat dander from time to time.   We could leave it out since she does not own a cat, but she will continue to suffer symptoms if she is around other persons who do own a cat or in a building where a cat has inhabited.  Will try medication therapy for now and revisit injections at follow-up.  Patient Instructions  Chronic Rhinitis: seasonal and perennial allergic rhinitis - allergy testing today was positive to cats, grasses and borderline to weeds on skin, intradermals were positive to dust mites; avoidance measures below - continue zyrtec 10 mg daily - continue atrovent (ipatropium bromide) nasal spray 0.03% can use 1-2 sprays up to three times daily, titrate based on  symptoms/mucosal dryness - if not controlled, switch from zytec to Xyzal (levocetirizine) 5 mg daily - if not controlled add Nasacort (triamcinolone) or Flonase Sensimist 1-2 sprays daily; if side effects recur, discontinue and we will not try steroid nasal sprays again - consider allergy shots on slowest schedule given previous intolerance  History of rash following celebrex (celecoxib):  -avoidance of COX2 specific NSAID inhibitors - okay to use other NSAIDs   Follow-up  in 3 months, sooner if needed.   Sigurd Sos, MD  [facility]

## 2021-03-02 ENCOUNTER — Encounter: Payer: Self-pay | Admitting: Internal Medicine

## 2021-03-02 ENCOUNTER — Ambulatory Visit: Payer: BC Managed Care – PPO | Admitting: Internal Medicine

## 2021-03-02 ENCOUNTER — Other Ambulatory Visit: Payer: Self-pay

## 2021-03-02 VITALS — BP 122/80 | HR 89 | Temp 98.4°F | Resp 12

## 2021-03-02 DIAGNOSIS — J310 Chronic rhinitis: Secondary | ICD-10-CM

## 2021-03-02 DIAGNOSIS — Z889 Allergy status to unspecified drugs, medicaments and biological substances status: Secondary | ICD-10-CM

## 2021-03-02 DIAGNOSIS — J302 Other seasonal allergic rhinitis: Secondary | ICD-10-CM

## 2021-03-02 DIAGNOSIS — J3089 Other allergic rhinitis: Secondary | ICD-10-CM

## 2021-03-02 NOTE — Patient Instructions (Addendum)
Chronic Rhinitis: seasonal and perennial allergic - allergy testing today was positive to cats, grasses and borderline to weeds on skin, intradermals were positive to dust mites; avoidance measures below - continue zyrtec 10 mg daily - continue atrovent (ipatropium bromide) nasal spray 0.03% can use 1-2 sprays up to three times daily, titrate based on symptoms/mucosal dryness - if not controlled, switch from zytec to Xyzal (levocetirizine) 5 mg daily - if not controlled add Nasacort (triamcinolone) or Flonase Sensimist 1-2 sprays daily; if side effects recur, discontinue and we will not try steroid nasal sprays again - consider allergy shots on slowest schedule given previous intolerance  History of rash following celebrex (celecoxib):  -avoidance of COX2 specific NSAID inhibitors - okay to use other NSAIDs   Follow-up  in 3 months, sooner if needed.   It was a pleasure seeing you again in clinic today!  Sigurd Sos, MD [facility]  DUST MITE AVOIDANCE MEASURES:  There are three main measures that need and can be taken to avoid house dust mites:  Reduce accumulation of dust in general -reduce furniture, clothing, carpeting, books, stuffed animals, especially in bedroom  Separate yourself from the dust -use pillow and mattress encasements (can be found at stores such as [employer] or online) -avoid direct exposure to air condition flow -use a HEPA filter device, especially in the bedroom; you can also use a HEPA filter vacuum cleaner -wipe dust with a moist towel instead of a dry towel or broom when cleaning  Decrease mites and/or their secretions -wash clothing and linen and stuffed animals at highest temperature possible, at least every 2 weeks -stuffed animals can also be placed in a bag and put in a freezer overnight  Despite the above measures, it is impossible to eliminate dust mites or their allergen completely from your home.  With the above  measures the burden of mites in your home can be diminished, with the goal of minimizing your allergic symptoms.  Success will be reached only when implementing and using all means together.  Control of Dog or Cat Allergen  Avoidance is the best way to manage a dog or cat allergy. If you have a dog or cat and are allergic to dog or cats, consider removing the dog or cat from the home. If you have a dog or cat but dont want to find it a new home, or if your family wants a pet even though someone in the household is allergic, here are some strategies that may help keep symptoms at bay:  Keep the pet out of your bedroom and restrict it to only a few rooms. Be advised that keeping the dog or cat in only one room will not limit the allergens to that room. Dont pet, hug or kiss the dog or cat; if you do, wash your hands with soap and water. High-efficiency particulate air (HEPA) cleaners run continuously in a bedroom or living room can reduce allergen levels over time. Regular use of a high-efficiency vacuum cleaner or a central vacuum can reduce allergen levels. Giving your dog or cat a bath at least once a week can reduce airborne allergen.  Reducing Pollen Exposure  The American Academy of Allergy, Asthma and Immunology suggests the following steps to reduce your exposure to pollen during allergy seasons.    Do not hang sheets or clothing out to dry; pollen may collect on these items. Do not mow lawns or spend time around freshly cut  grass; mowing stirs up pollen. Keep windows closed at night.  Keep car windows closed while driving. Minimize morning activities outdoors, a time when pollen counts are usually at their highest. Stay indoors as much as possible when pollen counts or humidity is high and on windy days when pollen tends to remain in the air longer. Use air conditioning when possible.  Many air conditioners have filters that trap the pollen spores. Use a HEPA room air filter to remove  pollen form the indoor air you breathe.

## 2021-03-08 DIAGNOSIS — Z85828 Personal history of other malignant neoplasm of skin: Secondary | ICD-10-CM | POA: Diagnosis not present

## 2021-03-08 DIAGNOSIS — D2261 Melanocytic nevi of right upper limb, including shoulder: Secondary | ICD-10-CM | POA: Diagnosis not present

## 2021-03-08 DIAGNOSIS — L245 Irritant contact dermatitis due to other chemical products: Secondary | ICD-10-CM | POA: Diagnosis not present

## 2021-03-08 DIAGNOSIS — L821 Other seborrheic keratosis: Secondary | ICD-10-CM | POA: Diagnosis not present

## 2021-04-08 ENCOUNTER — Ambulatory Visit: Payer: Self-pay | Admitting: Allergy

## 2021-04-11 ENCOUNTER — Ambulatory Visit (INDEPENDENT_AMBULATORY_CARE_PROVIDER_SITE_OTHER)
Admission: RE | Admit: 2021-04-11 | Discharge: 2021-04-11 | Disposition: A | Discharge status: Home / Self Care | Payer: Self-pay | Source: Ambulatory Visit | Attending: Internal Medicine | Admitting: Internal Medicine

## 2021-04-11 ENCOUNTER — Other Ambulatory Visit: Payer: Self-pay | Admitting: Internal Medicine

## 2021-04-11 DIAGNOSIS — Z1231 Encounter for screening mammogram for malignant neoplasm of breast: Secondary | ICD-10-CM

## 2021-04-11 DIAGNOSIS — Z136 Encounter for screening for cardiovascular disorders: Secondary | ICD-10-CM

## 2021-04-12 ENCOUNTER — Encounter: Payer: Self-pay | Admitting: Internal Medicine

## 2021-05-27 ENCOUNTER — Ambulatory Visit
Admission: RE | Admit: 2021-05-27 | Discharge: 2021-05-27 | Disposition: A | Discharge status: Home / Self Care | Payer: BC Managed Care – PPO | Source: Ambulatory Visit | Attending: Internal Medicine | Admitting: Internal Medicine

## 2021-05-27 DIAGNOSIS — Z1231 Encounter for screening mammogram for malignant neoplasm of breast: Secondary | ICD-10-CM | POA: Diagnosis not present

## 2021-06-01 ENCOUNTER — Ambulatory Visit: Payer: BC Managed Care – PPO | Admitting: Internal Medicine

## 2021-06-27 ENCOUNTER — Encounter: Payer: Self-pay | Admitting: Internal Medicine

## 2021-07-19 DIAGNOSIS — F4322 Adjustment disorder with anxiety: Secondary | ICD-10-CM | POA: Diagnosis not present

## 2021-07-20 DIAGNOSIS — M79642 Pain in left hand: Secondary | ICD-10-CM | POA: Diagnosis not present

## 2021-07-20 DIAGNOSIS — Z4789 Encounter for other orthopedic aftercare: Secondary | ICD-10-CM | POA: Diagnosis not present

## 2021-07-20 DIAGNOSIS — M65342 Trigger finger, left ring finger: Secondary | ICD-10-CM | POA: Diagnosis not present

## 2021-08-11 DIAGNOSIS — F4322 Adjustment disorder with anxiety: Secondary | ICD-10-CM | POA: Diagnosis not present

## 2021-08-29 DIAGNOSIS — F4322 Adjustment disorder with anxiety: Secondary | ICD-10-CM | POA: Diagnosis not present

## 2021-09-13 DIAGNOSIS — F4322 Adjustment disorder with anxiety: Secondary | ICD-10-CM | POA: Diagnosis not present

## 2021-09-14 DIAGNOSIS — M65342 Trigger finger, left ring finger: Secondary | ICD-10-CM | POA: Diagnosis not present

## 2021-09-14 DIAGNOSIS — M79642 Pain in left hand: Secondary | ICD-10-CM | POA: Diagnosis not present

## 2021-09-14 DIAGNOSIS — Z4789 Encounter for other orthopedic aftercare: Secondary | ICD-10-CM | POA: Diagnosis not present

## 2021-09-29 ENCOUNTER — Encounter: Payer: Self-pay | Admitting: Internal Medicine

## 2021-10-25 ENCOUNTER — Ambulatory Visit (INDEPENDENT_AMBULATORY_CARE_PROVIDER_SITE_OTHER): Payer: BC Managed Care – PPO | Admitting: *Deleted

## 2021-10-25 DIAGNOSIS — Z23 Encounter for immunization: Secondary | ICD-10-CM | POA: Diagnosis not present

## 2021-10-25 NOTE — Progress Notes (Signed)
Administered regular flu shot right deltoid. Pt tolerated well.

## 2021-10-26 DIAGNOSIS — F4322 Adjustment disorder with anxiety: Secondary | ICD-10-CM | POA: Diagnosis not present

## 2021-11-07 DIAGNOSIS — F4322 Adjustment disorder with anxiety: Secondary | ICD-10-CM | POA: Diagnosis not present

## 2021-11-10 ENCOUNTER — Telehealth: Payer: BC Managed Care – PPO | Admitting: Family Medicine

## 2021-11-10 DIAGNOSIS — J208 Acute bronchitis due to other specified organisms: Secondary | ICD-10-CM

## 2021-11-10 MED ORDER — BENZONATATE 100 MG PO CAPS
100.0000 mg | ORAL_CAPSULE | Freq: Two times a day (BID) | ORAL | 0 refills | Status: DC | PRN
Start: 2021-11-10 — End: 2021-11-13

## 2021-11-10 NOTE — Progress Notes (Signed)
We are sorry that you are not feeling well.  Here is how we plan to help!  Based on your presentation I believe you most likely have A cough due to a virus.  This is called viral bronchitis and is best treated by rest, plenty of fluids and control of the cough.  You may use Ibuprofen or Tylenol as directed to help your symptoms.     In addition you may use A non-prescription cough medication called Robitussin DAC. Take 2 teaspoons every 8 hours or Delsym: take 2 teaspoons every 12 hours. and A prescription cough medication called Tessalon Perles '100mg'$ . You may take 1-2 capsules every 8 hours as needed for your cough.   From your responses in the eVisit questionnaire you describe inflammation in the upper respiratory tract which is causing a significant cough.  This is commonly called Bronchitis and has four common causes:   Allergies Viral Infections Acid Reflux Bacterial Infection Allergies, viruses and acid reflux are treated by controlling symptoms or eliminating the cause. An example might be a cough caused by taking certain blood pressure medications. You stop the cough by changing the medication. Another example might be a cough caused by acid reflux. Controlling the reflux helps control the cough.  USE OF BRONCHODILATOR ("RESCUE") INHALERS: There is a risk from using your bronchodilator too frequently.  The risk is that over-reliance on a medication which only relaxes the muscles surrounding the breathing tubes can reduce the effectiveness of medications prescribed to reduce swelling and congestion of the tubes themselves.  Although you feel brief relief from the bronchodilator inhaler, your asthma may actually be worsening with the tubes becoming more swollen and filled with mucus.  This can delay other crucial treatments, such as oral steroid medications. If you need to use a bronchodilator inhaler daily, several times per day, you should discuss this with your provider.  There are probably  better treatments that could be used to keep your asthma under control.     HOME CARE Only take medications as instructed by your medical team. Complete the entire course of an antibiotic. Drink plenty of fluids and get plenty of rest. Avoid close contacts especially the very young and the elderly Cover your mouth if you cough or cough into your sleeve. Always remember to wash your hands A steam or ultrasonic humidifier can help congestion.   GET HELP RIGHT AWAY IF: You develop worsening fever. You become short of breath You cough up blood. Your symptoms persist after you have completed your treatment plan MAKE SURE YOU  Understand these instructions. Will watch your condition. Will get help right away if you are not doing well or get worse.    Thank you for choosing an e-visit.  Your e-visit answers were reviewed by a board certified advanced clinical practitioner to complete your personal care plan. Depending upon the condition, your plan could have included both over the counter or prescription medications.  Please review your pharmacy choice. Make sure the pharmacy is open so you can pick up prescription now. If there is a problem, you may contact your provider through [employer] and have the prescription routed to another pharmacy.  Your safety is important to [place]. If you have drug allergies check your prescription carefully.   For the next 24 hours you can use MyChart to ask questions about today's visit, request a non-urgent call back, or ask for a work or school excuse. You will get an email in the next two days  asking about your experience. I hope that your e-visit has been valuable and will speed your recovery.  I provided 5 minutes of non face-to-face time during this encounter for chart review, medication and order placement, as well as and documentation.

## 2021-11-13 ENCOUNTER — Telehealth: Payer: BC Managed Care – PPO | Admitting: Family

## 2021-11-13 DIAGNOSIS — B9689 Other specified bacterial agents as the cause of diseases classified elsewhere: Secondary | ICD-10-CM

## 2021-11-13 MED ORDER — BENZONATATE 100 MG PO CAPS: 100.0000 mg | ORAL_CAPSULE | Freq: Three times a day (TID) | ORAL | 0 refills | Status: DC | PRN

## 2021-11-13 MED ORDER — DOXYCYCLINE HYCLATE 100 MG PO TABS
100.0000 mg | ORAL_TABLET | Freq: Two times a day (BID) | ORAL | 0 refills | Status: DC
Start: 2021-11-13 — End: 2022-03-21

## 2021-11-13 NOTE — Progress Notes (Signed)

## 2021-11-16 ENCOUNTER — Telehealth: Payer: BC Managed Care – PPO | Admitting: Internal Medicine

## 2021-11-16 ENCOUNTER — Encounter: Payer: Self-pay | Admitting: Internal Medicine

## 2021-11-16 DIAGNOSIS — J069 Acute upper respiratory infection, unspecified: Secondary | ICD-10-CM | POA: Diagnosis not present

## 2021-11-16 NOTE — Assessment & Plan Note (Signed)
Acute Initially diagnosed with viral bronchitis and then possibly bacterial bronchitis due to persistence of symptoms On doxycycline-okay to complete course Most likely her symptoms are viral in nature Symptoms have improved, but still having a persistent low-grade fever of 99 something daily-discussed this can occur with a viral illness 1 would expected to go away, but can linger for several days Continue Tylenol as needed for the fever Continuesymptomatic treatment for symptoms Rest, fluids Advised to let me know if her low-grade fever persists after another week

## 2021-11-16 NOTE — Progress Notes (Signed)
Virtual Visit via Video Note  I connected with Erin Stokes on 11/16/21 at  3:40 PM EST by a video enabled telemedicine application and verified that I am speaking with the correct person using two identifiers.   I discussed the limitations of evaluation and management by telemedicine and the availability of in person appointments. The patient expressed understanding and agreed to proceed.  Present for the visit:  Myself, Dr Billey Gosling, Haynes Hoehn.  The patient is currently at home and I am in the office.    No referring provider.    History of Present Illness: This is an acute visit for low-grade fever more than 1 week.   Symptoms started 10/28 after returning home from [place].  11/2-virtual visit-diagnosis acute viral bronchitis 11/5-virtual visit-diagnosis acute bacterial bronchitis-started on doxycycline x 7 days and Tessalon Perles.  Upon returning back from [place] she was very fatigued and thought that was related to everything she was doing.  She developed a sporadic dry cough.  She did have a little bit of burning with deep breaths.  She did develop a fever to 101 about a week ago.  Since then she has continued to have a low-grade fever of about 99 which is unusual for her.  She has been taking Tylenol on a regular basis.  Her body aches, fatigue and cough have improved some.  Shortness of breath or burning sensation in the chest have improved some.  She still has these to some degree.  She did have some mild headaches.  She is concerned about the low-grade fever-has never had one last this long.  She is not sure if it was okay to continue to take Tylenol.  She is currently taking the doxycycline, Tylenol, Delsym cough syrup.  Covid x 3 negative.     Review of Systems  Constitutional:  Positive for fever (low grade) and malaise/fatigue.  HENT:  Negative for congestion, ear pain, sinus pain and sore throat.        ? Swollen glands  Respiratory:  Positive for  cough (dry) and shortness of breath (does not have full breath - some SOB with stairs). Negative for wheezing.   Gastrointestinal:  Negative for diarrhea, nausea and vomiting.  Musculoskeletal:  Positive for myalgias.  Neurological:  Positive for headaches (mild). Negative for dizziness.     Social History   Socioeconomic History   Marital status: Married    Spouse name: Not on file   Number of children: Not on file   Years of education: Not on file   Highest education level: Not on file  Occupational History   Not on file  Tobacco Use   Smoking status: Never    Passive exposure: Never   Smokeless tobacco: Never  Vaping Use   Vaping Use: Never used  Substance and Sexual Activity   Alcohol use: Yes    Alcohol/week: 14.0 standard drinks of alcohol    Types: 14 Glasses of wine per week    Comment: 2 drinks per day   Drug use: No   Sexual activity: Yes  Other Topics Concern   Not on file  Social History Narrative   Not on file   Social Determinants of Health   Financial Resource Strain: Not on file  Food Insecurity: Not on file  Transportation Needs: Not on file  Physical Activity: Not on file  Stress: Not on file  Social Connections: Not on file     Observations/Objective: Appears well in NAD Breathing  normally Skin appears warm and dry  Assessment and Plan:  See Problem List for Assessment and Plan of chronic medical problems.   Follow Up Instructions:    I discussed the assessment and treatment plan with the patient. The patient was provided an opportunity to ask questions and all were answered. The patient agreed with the plan and demonstrated an understanding of the instructions.   The patient was advised to call back or seek an in-person evaluation if the symptoms worsen or if the condition fails to improve as anticipated.    Binnie Rail, MD

## 2021-11-23 DIAGNOSIS — F4322 Adjustment disorder with anxiety: Secondary | ICD-10-CM | POA: Diagnosis not present

## 2021-12-12 DIAGNOSIS — F4322 Adjustment disorder with anxiety: Secondary | ICD-10-CM | POA: Diagnosis not present

## 2021-12-26 DIAGNOSIS — F4322 Adjustment disorder with anxiety: Secondary | ICD-10-CM | POA: Diagnosis not present

## 2022-01-16 DIAGNOSIS — Z4789 Encounter for other orthopedic aftercare: Secondary | ICD-10-CM | POA: Diagnosis not present

## 2022-01-18 DIAGNOSIS — M25562 Pain in left knee: Secondary | ICD-10-CM | POA: Diagnosis not present

## 2022-01-18 DIAGNOSIS — M1712 Unilateral primary osteoarthritis, left knee: Secondary | ICD-10-CM | POA: Diagnosis not present

## 2022-01-18 DIAGNOSIS — M170 Bilateral primary osteoarthritis of knee: Secondary | ICD-10-CM | POA: Diagnosis not present

## 2022-01-19 DIAGNOSIS — F4322 Adjustment disorder with anxiety: Secondary | ICD-10-CM | POA: Diagnosis not present

## 2022-01-30 DIAGNOSIS — M79642 Pain in left hand: Secondary | ICD-10-CM | POA: Diagnosis not present

## 2022-02-20 DIAGNOSIS — F4322 Adjustment disorder with anxiety: Secondary | ICD-10-CM | POA: Diagnosis not present

## 2022-03-01 DIAGNOSIS — Z124 Encounter for screening for malignant neoplasm of cervix: Secondary | ICD-10-CM | POA: Diagnosis not present

## 2022-03-01 DIAGNOSIS — Z1151 Encounter for screening for human papillomavirus (HPV): Secondary | ICD-10-CM | POA: Diagnosis not present

## 2022-03-01 DIAGNOSIS — Z681 Body mass index (BMI) 19 or less, adult: Secondary | ICD-10-CM | POA: Diagnosis not present

## 2022-03-01 DIAGNOSIS — Z01419 Encounter for gynecological examination (general) (routine) without abnormal findings: Secondary | ICD-10-CM | POA: Diagnosis not present

## 2022-03-20 ENCOUNTER — Telehealth: Payer: Self-pay | Admitting: Internal Medicine

## 2022-03-20 DIAGNOSIS — Z0000 Encounter for general adult medical examination without abnormal findings: Secondary | ICD-10-CM

## 2022-03-20 DIAGNOSIS — F4322 Adjustment disorder with anxiety: Secondary | ICD-10-CM | POA: Diagnosis not present

## 2022-03-20 NOTE — Telephone Encounter (Signed)
Patient called requesting for labs orders to be placed prior to her visit on 05/24/2022 for her physical with Dr. Quay Burow. Best callback number is [phone].

## 2022-03-21 DIAGNOSIS — M1712 Unilateral primary osteoarthritis, left knee: Secondary | ICD-10-CM | POA: Diagnosis not present

## 2022-03-21 NOTE — Telephone Encounter (Signed)
Pt has [employer] is labs ok prior.Marland KitchenJohny Stokes

## 2022-03-21 NOTE — Telephone Encounter (Signed)
ordered

## 2022-03-22 NOTE — Telephone Encounter (Signed)
Message sent to patient via mychart to let me know when she wanted to come in for labs

## 2022-03-28 DIAGNOSIS — M1712 Unilateral primary osteoarthritis, left knee: Secondary | ICD-10-CM | POA: Diagnosis not present

## 2022-04-04 DIAGNOSIS — M1712 Unilateral primary osteoarthritis, left knee: Secondary | ICD-10-CM | POA: Diagnosis not present

## 2022-04-05 DIAGNOSIS — F4322 Adjustment disorder with anxiety: Secondary | ICD-10-CM | POA: Diagnosis not present

## 2022-04-06 ENCOUNTER — Other Ambulatory Visit (INDEPENDENT_AMBULATORY_CARE_PROVIDER_SITE_OTHER): Payer: BC Managed Care – PPO

## 2022-04-06 DIAGNOSIS — Z0000 Encounter for general adult medical examination without abnormal findings: Secondary | ICD-10-CM

## 2022-04-06 LAB — COMPREHENSIVE METABOLIC PANEL
ALT: 24 U/L (ref 0–35)
AST: 24 U/L (ref 0–37)
Albumin: 4.4 g/dL (ref 3.5–5.2)
Alkaline Phosphatase: 60 U/L (ref 39–117)
BUN: 14 mg/dL (ref 6–23)
CO2: 30 mEq/L (ref 19–32)
Calcium: 9.5 mg/dL (ref 8.4–10.5)
Chloride: 104 mEq/L (ref 96–112)
Creatinine, Ser: 0.77 mg/dL (ref 0.40–1.20)
GFR: 83.28 mL/min (ref >60.00)
Glucose, Bld: 95 mg/dL (ref 70–99)
Potassium: 4.3 mEq/L (ref 3.5–5.1)
Sodium: 139 mEq/L (ref 135–145)
Total Bilirubin: 1.1 mg/dL (ref 0.2–1.2)
Total Protein: 6.8 g/dL (ref 6.0–8.3)

## 2022-04-06 LAB — CBC WITH DIFFERENTIAL/PLATELET
Basophils Absolute: 0.1 10*3/uL (ref 0.0–0.1)
Basophils Relative: 1.3 % (ref 0.0–3.0)
Eosinophils Absolute: 0.1 10*3/uL (ref 0.0–0.7)
Eosinophils Relative: 3 % (ref 0.0–5.0)
HCT: 43.6 % (ref 36.0–46.0)
Hemoglobin: 14.9 g/dL (ref 12.0–15.0)
Lymphocytes Relative: 55.1 % — ABNORMAL HIGH (ref 12.0–46.0)
Lymphs Abs: 2.3 10*3/uL (ref 0.7–4.0)
MCHC: 34.1 g/dL (ref 30.0–36.0)
MCV: 92.8 fl (ref 78.0–100.0)
Monocytes Absolute: 0.3 10*3/uL (ref 0.1–1.0)
Monocytes Relative: 8.3 % (ref 3.0–12.0)
Neutro Abs: 1.4 10*3/uL (ref 1.4–7.7)
Neutrophils Relative %: 32.3 % — ABNORMAL LOW (ref 43.0–77.0)
Platelets: 233 10*3/uL (ref 150.0–400.0)
RBC: 4.7 Mil/uL (ref 3.87–5.11)
RDW: 13.8 % (ref 11.5–15.5)
WBC: 4.2 10*3/uL (ref 4.0–10.5)

## 2022-04-06 LAB — LIPID PANEL
Cholesterol: 237 mg/dL — ABNORMAL HIGH (ref 0–200)
HDL: 133.1 mg/dL (ref >39.00)
LDL Cholesterol: 95 mg/dL (ref 0–99)
NonHDL: 104.34
Total CHOL/HDL Ratio: 2
Triglycerides: 48 mg/dL (ref 0.0–149.0)
VLDL: 9.6 mg/dL (ref 0.0–40.0)

## 2022-04-06 LAB — TSH: TSH: 2.74 u[IU]/mL (ref 0.35–5.50)

## 2022-04-18 ENCOUNTER — Other Ambulatory Visit: Payer: Self-pay | Admitting: Internal Medicine

## 2022-04-18 DIAGNOSIS — Z1231 Encounter for screening mammogram for malignant neoplasm of breast: Secondary | ICD-10-CM

## 2022-04-18 NOTE — Patient Instructions (Addendum)
Blood work was ordered.   The lab is on the first floor.    Medications changes include :       A referral was ordered for XXX.     Someone will call you to schedule an appointment.    Return in about 1 year (around 04/19/2023) for Physical Exam.   Health Maintenance, Female Adopting a healthy lifestyle and getting preventive care are important in promoting health and wellness. Ask your health care provider about: The right schedule for you to have regular tests and exams. Things you can do on your own to prevent diseases and keep yourself healthy. What should I know about diet, weight, and exercise? Eat a healthy diet  Eat a diet that includes plenty of vegetables, fruits, low-fat dairy products, and lean protein. Do not eat a lot of foods that are high in solid fats, added sugars, or sodium. Maintain a healthy weight Body mass index (BMI) is used to identify weight problems. It estimates body fat based on height and weight. Your health care provider can help determine your BMI and help you achieve or maintain a healthy weight. Get regular exercise Get regular exercise. This is one of the most important things you can do for your health. Most adults should: Exercise for at least 150 minutes each week. The exercise should increase your heart rate and make you sweat (moderate-intensity exercise). Do strengthening exercises at least twice a week. This is in addition to the moderate-intensity exercise. Spend less time sitting. Even light physical activity can be beneficial. Watch cholesterol and blood lipids Have your blood tested for lipids and cholesterol at 62 years of age, then have this test every 5 years. Have your cholesterol levels checked more often if: Your lipid or cholesterol levels are high. You are older than 62 years of age. You are at high risk for heart disease. What should I know about cancer screening? Depending on your health history and family history,  you may need to have cancer screening at various ages. This may include screening for: Breast cancer. Cervical cancer. Colorectal cancer. Skin cancer. Lung cancer. What should I know about heart disease, diabetes, and high blood pressure? Blood pressure and heart disease High blood pressure causes heart disease and increases the risk of stroke. This is more likely to develop in people who have high blood pressure readings or are overweight. Have your blood pressure checked: Every 3-5 years if you are 23-36 years of age. Every year if you are 69 years old or older. Diabetes Have regular diabetes screenings. This checks your fasting blood sugar level. Have the screening done: Once every three years after age 23 if you are at a normal weight and have a low risk for diabetes. More often and at a younger age if you are overweight or have a high risk for diabetes. What should I know about preventing infection? Hepatitis B If you have a higher risk for hepatitis B, you should be screened for this virus. Talk with your health care provider to find out if you are at risk for hepatitis B infection. Hepatitis C Testing is recommended for: Everyone born from [birth year] through 1965. Anyone with known risk factors for hepatitis C. Sexually transmitted infections (STIs) Get screened for STIs, including gonorrhea and chlamydia, if: You are sexually active and are younger than 62 years of age. You are older than 62 years of age and your health care provider tells you that you are at  increased risk for chlamydia or gonorrhea. Ask your health care provider if you are at risk. Ask your health care provider about whether you are at high risk for HIV. Your health care provider may recommend a prescription medicine to help prevent HIV infection. If you choose to take medicine to prevent HIV, you should first get tested for HIV. You should then be tested every 3 months for as long as you  are taking the medicine. Pregnancy If you are about to stop having your period (premenopausal) and you may become pregnant, seek counseling before you get pregnant. Take 400 to 800 micrograms (mcg) of folic acid every day if you become pregnant. Ask for birth control (contraception) if you want to prevent pregnancy. Osteoporosis and menopause Osteoporosis is a disease in which the bones lose minerals and strength with aging. This can result in bone fractures. If you are 65 years old or older, or if you are at risk for osteoporosis and fractures, ask your health care provider if you should: Be screened for bone loss. Take a calcium or vitamin D supplement to lower your risk of fractures. Be given hormone replacement therapy (HRT) to treat symptoms of menopause. Follow these instructions at home: Alcohol use Do not drink alcohol if: Your health care provider tells you not to drink. You are pregnant, may be pregnant, or are planning to become pregnant. If you drink alcohol: Limit how much you have to: 0-1 drink a day. Know how much alcohol is in your drink. In the [place] one drink equals one 12 oz bottle of beer (355 mL), one 5 oz glass of wine (148 mL), or one 1 oz glass of hard liquor (44 mL). Lifestyle Do not use any products that contain nicotine or tobacco. These products include cigarettes, chewing tobacco, and vaping devices, such as e-cigarettes. If you need help quitting, ask your health care provider. Do not use street drugs. Do not share needles. Ask your health care provider for help if you need support or information about quitting drugs. General instructions Schedule regular health, dental, and eye exams. Stay current with your vaccines. Tell your health care provider if: You often feel depressed. You have ever been abused or do not feel safe at home. Summary Adopting a healthy lifestyle and getting preventive care are important in promoting health and wellness. Follow your  health care provider's instructions about healthy diet, exercising, and getting tested or screened for diseases. Follow your health care provider's instructions on monitoring your cholesterol and blood pressure. This information is not intended to replace advice given to you by your health care provider. Make sure you discuss any questions you have with your health care provider. Document Revised: 05/17/2020 Document Reviewed: 05/17/2020 Elsevier Patient Education  2023 Elsevier Inc.  

## 2022-04-18 NOTE — Progress Notes (Unsigned)
Subjective:    Patient ID: Erin Stokes, female    DOB: [birth year], 62 y.o.   MRN: [id number]      HPI Erin Stokes is here for a Physical exam and her chronic medical problems.   ? dexa   Medications and allergies reviewed with patient and updated if appropriate.  Current Outpatient Medications on File Prior to Visit  Medication Sig Dispense Refill   Ascorbic Acid (VITAMIN C PO) Take 500 mg by mouth daily.     CALCIUM PO Take by mouth. With magnesium     cetirizine (ZYRTEC) 10 MG tablet Take 10 mg by mouth daily.     Cholecalciferol (VITAMIN D-3 PO) Take 1,000 Units by mouth daily.     ibuprofen (ADVIL,MOTRIN) 100 MG tablet Take 100 mg by mouth every 6 (six) hours as needed.     ipratropium (ATROVENT) 0.03 % nasal spray USE 1 SPRAY IN EACH NOSTRIL THREE TIMES DAILY 30 mL 6   MELATONIN PO Take 2 mg by mouth as needed.      Multiple Vitamins-Minerals (MULTIVITAMIN PO) Take by mouth daily.     Naproxen Sodium (ALEVE PO) Take by mouth in the morning and at bedtime.     No current facility-administered medications on file prior to visit.    Review of Systems     Objective:  There were no vitals filed for this visit. There were no vitals filed for this visit. There is no height or weight on file to calculate BMI.  BP Readings from Last 3 Encounters:  03/02/21 122/80  02/23/21 124/80  02/17/21 102/68    Wt Readings from Last 3 Encounters:  02/23/21 120 lb 9.6 oz (54.7 kg)  02/17/21 119 lb 12.8 oz (54.3 kg)  03/08/20 125 lb (56.7 kg)       Physical Exam Constitutional: She appears well-developed and well-nourished. No distress.  HENT:  Head: Normocephalic and atraumatic.  Right Ear: External ear normal. Normal ear canal and TM Left Ear: External ear normal.  Normal ear canal and TM Mouth/Throat: Oropharynx is clear and moist.  Eyes: Conjunctivae normal.  Neck: Neck supple. No tracheal deviation present. No thyromegaly present.  No carotid bruit   Cardiovascular: Normal rate, regular rhythm and normal heart sounds.   No murmur heard.  No edema. Pulmonary/Chest: Effort normal and breath sounds normal. No respiratory distress. She has no wheezes. She has no rales.  Breast: deferred   Abdominal: Soft. She exhibits no distension. There is no tenderness.  Lymphadenopathy: She has no cervical adenopathy.  Skin: Skin is warm and dry. She is not diaphoretic.  Psychiatric: She has a normal mood and affect. Her behavior is normal.     Lab Results  Component Value Date   WBC 4.2 04/06/2022   HGB 14.9 04/06/2022   HCT 43.6 04/06/2022   PLT 233.0 04/06/2022   GLUCOSE 95 04/06/2022   CHOL 237 (H) 04/06/2022   TRIG 48.0 04/06/2022   HDL 133.10 04/06/2022   LDLCALC 95 04/06/2022   ALT 24 04/06/2022   AST 24 04/06/2022   NA 139 04/06/2022   K 4.3 04/06/2022   CL 104 04/06/2022   CREATININE 0.77 04/06/2022   BUN 14 04/06/2022   CO2 30 04/06/2022   TSH 2.74 04/06/2022   HGBA1C 5.4 11/01/2018         Assessment & Plan:   Physical exam: Screening blood work  ordered Exercise   Weight  normal Substance abuse  none   Reviewed recommended immunizations.  Health Maintenance  Topic Date Due   HIV Screening  Never done   PAP SMEAR-Modifier  02/04/2021   COVID-19 Vaccine (5 - 2023-24 season) 12/07/2021   INFLUENZA VACCINE  08/10/2022   COLONOSCOPY (Pts 45-69yrs Insurance coverage will need to be confirmed)  10/15/2022   MAMMOGRAM  05/28/2023   DTaP/Tdap/Td (3 - Td or Tdap) 04/27/2025   Hepatitis C Screening  Completed   Zoster Vaccines- Shingrix  Completed   HPV VACCINES  Aged Out          See Problem List for Assessment and Plan of chronic medical problems.

## 2022-04-19 ENCOUNTER — Encounter: Payer: Self-pay | Admitting: Internal Medicine

## 2022-04-19 ENCOUNTER — Ambulatory Visit (INDEPENDENT_AMBULATORY_CARE_PROVIDER_SITE_OTHER): Payer: BC Managed Care – PPO | Admitting: Internal Medicine

## 2022-04-19 VITALS — BP 120/72 | HR 70 | Temp 98.2°F | Ht 65.0 in | Wt 121.6 lb

## 2022-04-19 DIAGNOSIS — J302 Other seasonal allergic rhinitis: Secondary | ICD-10-CM | POA: Diagnosis not present

## 2022-04-19 DIAGNOSIS — J3089 Other allergic rhinitis: Secondary | ICD-10-CM

## 2022-04-19 DIAGNOSIS — Z0000 Encounter for general adult medical examination without abnormal findings: Secondary | ICD-10-CM | POA: Diagnosis not present

## 2022-04-19 DIAGNOSIS — M8580 Other specified disorders of bone density and structure, unspecified site: Secondary | ICD-10-CM | POA: Diagnosis not present

## 2022-04-19 NOTE — Assessment & Plan Note (Signed)
Controlled with otc meds

## 2022-04-19 NOTE — Assessment & Plan Note (Signed)
Chronic Dexa up to date with gyn

## 2022-04-20 ENCOUNTER — Other Ambulatory Visit: Payer: Self-pay | Admitting: Internal Medicine

## 2022-04-20 DIAGNOSIS — M25562 Pain in left knee: Secondary | ICD-10-CM | POA: Diagnosis not present

## 2022-04-26 DIAGNOSIS — H52203 Unspecified astigmatism, bilateral: Secondary | ICD-10-CM | POA: Diagnosis not present

## 2022-04-26 DIAGNOSIS — H04123 Dry eye syndrome of bilateral lacrimal glands: Secondary | ICD-10-CM | POA: Diagnosis not present

## 2022-04-26 DIAGNOSIS — H2513 Age-related nuclear cataract, bilateral: Secondary | ICD-10-CM | POA: Diagnosis not present

## 2022-04-27 DIAGNOSIS — F4322 Adjustment disorder with anxiety: Secondary | ICD-10-CM | POA: Diagnosis not present

## 2022-05-01 DIAGNOSIS — L820 Inflamed seborrheic keratosis: Secondary | ICD-10-CM | POA: Diagnosis not present

## 2022-05-01 DIAGNOSIS — L718 Other rosacea: Secondary | ICD-10-CM | POA: Diagnosis not present

## 2022-05-01 DIAGNOSIS — D225 Melanocytic nevi of trunk: Secondary | ICD-10-CM | POA: Diagnosis not present

## 2022-05-01 DIAGNOSIS — Z85828 Personal history of other malignant neoplasm of skin: Secondary | ICD-10-CM | POA: Diagnosis not present

## 2022-05-01 DIAGNOSIS — L723 Sebaceous cyst: Secondary | ICD-10-CM | POA: Diagnosis not present

## 2022-05-04 DIAGNOSIS — M1712 Unilateral primary osteoarthritis, left knee: Secondary | ICD-10-CM | POA: Diagnosis not present

## 2022-05-24 ENCOUNTER — Encounter: Payer: BC Managed Care – PPO | Admitting: Internal Medicine

## 2022-05-25 DIAGNOSIS — F4322 Adjustment disorder with anxiety: Secondary | ICD-10-CM | POA: Diagnosis not present

## 2022-05-30 DIAGNOSIS — M25662 Stiffness of left knee, not elsewhere classified: Secondary | ICD-10-CM | POA: Diagnosis not present

## 2022-05-30 DIAGNOSIS — M6281 Muscle weakness (generalized): Secondary | ICD-10-CM | POA: Diagnosis not present

## 2022-05-30 DIAGNOSIS — M25562 Pain in left knee: Secondary | ICD-10-CM | POA: Diagnosis not present

## 2022-06-07 DIAGNOSIS — F4322 Adjustment disorder with anxiety: Secondary | ICD-10-CM | POA: Diagnosis not present

## 2022-06-12 ENCOUNTER — Ambulatory Visit
Admission: RE | Admit: 2022-06-12 | Discharge: 2022-06-12 | Disposition: A | Discharge status: Home / Self Care | Payer: BC Managed Care – PPO | Source: Ambulatory Visit | Attending: Internal Medicine | Admitting: Internal Medicine

## 2022-06-12 DIAGNOSIS — Z1231 Encounter for screening mammogram for malignant neoplasm of breast: Secondary | ICD-10-CM

## 2022-06-14 DIAGNOSIS — M25662 Stiffness of left knee, not elsewhere classified: Secondary | ICD-10-CM | POA: Diagnosis not present

## 2022-06-14 DIAGNOSIS — M6281 Muscle weakness (generalized): Secondary | ICD-10-CM | POA: Diagnosis not present

## 2022-06-14 DIAGNOSIS — M25562 Pain in left knee: Secondary | ICD-10-CM | POA: Diagnosis not present

## 2022-06-19 DIAGNOSIS — M25562 Pain in left knee: Secondary | ICD-10-CM | POA: Diagnosis not present

## 2022-06-19 DIAGNOSIS — M25662 Stiffness of left knee, not elsewhere classified: Secondary | ICD-10-CM | POA: Diagnosis not present

## 2022-06-19 DIAGNOSIS — M6281 Muscle weakness (generalized): Secondary | ICD-10-CM | POA: Diagnosis not present

## 2022-06-23 DIAGNOSIS — M25662 Stiffness of left knee, not elsewhere classified: Secondary | ICD-10-CM | POA: Diagnosis not present

## 2022-06-23 DIAGNOSIS — M6281 Muscle weakness (generalized): Secondary | ICD-10-CM | POA: Diagnosis not present

## 2022-06-23 DIAGNOSIS — M25562 Pain in left knee: Secondary | ICD-10-CM | POA: Diagnosis not present

## 2022-06-26 DIAGNOSIS — M6281 Muscle weakness (generalized): Secondary | ICD-10-CM | POA: Diagnosis not present

## 2022-06-26 DIAGNOSIS — M25562 Pain in left knee: Secondary | ICD-10-CM | POA: Diagnosis not present

## 2022-06-26 DIAGNOSIS — M25662 Stiffness of left knee, not elsewhere classified: Secondary | ICD-10-CM | POA: Diagnosis not present

## 2022-07-07 DIAGNOSIS — M25662 Stiffness of left knee, not elsewhere classified: Secondary | ICD-10-CM | POA: Diagnosis not present

## 2022-07-07 DIAGNOSIS — M25562 Pain in left knee: Secondary | ICD-10-CM | POA: Diagnosis not present

## 2022-07-07 DIAGNOSIS — M6281 Muscle weakness (generalized): Secondary | ICD-10-CM | POA: Diagnosis not present

## 2022-07-11 DIAGNOSIS — M6281 Muscle weakness (generalized): Secondary | ICD-10-CM | POA: Diagnosis not present

## 2022-07-11 DIAGNOSIS — M25662 Stiffness of left knee, not elsewhere classified: Secondary | ICD-10-CM | POA: Diagnosis not present

## 2022-07-11 DIAGNOSIS — M25562 Pain in left knee: Secondary | ICD-10-CM | POA: Diagnosis not present

## 2022-07-20 DIAGNOSIS — F4322 Adjustment disorder with anxiety: Secondary | ICD-10-CM | POA: Diagnosis not present

## 2022-07-25 ENCOUNTER — Encounter: Payer: Self-pay | Admitting: Gastroenterology

## 2022-08-07 DIAGNOSIS — M25562 Pain in left knee: Secondary | ICD-10-CM | POA: Diagnosis not present

## 2022-08-07 DIAGNOSIS — M6281 Muscle weakness (generalized): Secondary | ICD-10-CM | POA: Diagnosis not present

## 2022-08-07 DIAGNOSIS — M25662 Stiffness of left knee, not elsewhere classified: Secondary | ICD-10-CM | POA: Diagnosis not present

## 2022-08-13 ENCOUNTER — Encounter: Payer: Self-pay | Admitting: Internal Medicine

## 2022-08-13 NOTE — Progress Notes (Unsigned)
    Subjective:    Patient ID: Erin Stokes, female    DOB: 1960/03/24, 62 y.o.   MRN: [id number]      HPI Yenifer is here for No chief complaint on file.    ? Hernia -     Medications and allergies reviewed with patient and updated if appropriate.  Current Outpatient Medications on File Prior to Visit  Medication Sig Dispense Refill   Ascorbic Acid (VITAMIN C PO) Take 500 mg by mouth daily.     CALCIUM PO Take by mouth. With magnesium     cetirizine (ZYRTEC) 10 MG tablet Take 10 mg by mouth daily.     Cholecalciferol (VITAMIN D-3 PO) Take 1,000 Units by mouth daily.     ipratropium (ATROVENT) 0.03 % nasal spray USE 1 SPRAY IN EACH NOSTRIL THREE TIMES DAILY 30 mL 6   Multiple Vitamins-Minerals (MULTIVITAMIN PO) Take by mouth daily.     No current facility-administered medications on file prior to visit.    Review of Systems     Objective:  There were no vitals filed for this visit. BP Readings from Last 3 Encounters:  04/19/22 120/72  03/02/21 122/80  02/23/21 124/80   Wt Readings from Last 3 Encounters:  04/19/22 121 lb 9.6 oz (55.2 kg)  02/23/21 120 lb 9.6 oz (54.7 kg)  02/17/21 119 lb 12.8 oz (54.3 kg)   There is no height or weight on file to calculate BMI.    Physical Exam         Assessment & Plan:    See Problem List for Assessment and Plan of chronic medical problems.

## 2022-08-14 ENCOUNTER — Ambulatory Visit: Payer: BC Managed Care – PPO | Admitting: Internal Medicine

## 2022-08-14 VITALS — BP 110/80 | HR 70 | Temp 98.0°F | Ht 65.0 in | Wt 119.0 lb

## 2022-08-14 DIAGNOSIS — R1013 Epigastric pain: Secondary | ICD-10-CM | POA: Insufficient documentation

## 2022-08-14 DIAGNOSIS — R1010 Upper abdominal pain, unspecified: Secondary | ICD-10-CM

## 2022-08-14 NOTE — Assessment & Plan Note (Signed)
Subacute Has some discomfort - mild just below xiphoid process - worse with certain activities ? Hernia vs muscle strain/injury No obvious hernia on exam - would have to be very small Will get [place] to evaluate for hernia If [place] negative Consider sports med eval vs Ct scan  Other area in left lower rib cage - likely not related - has resolved - just monitor

## 2022-08-21 ENCOUNTER — Ambulatory Visit
Admission: RE | Admit: 2022-08-21 | Discharge: 2022-08-21 | Disposition: A | Discharge status: Home / Self Care | Payer: BC Managed Care – PPO | Source: Ambulatory Visit | Attending: Internal Medicine | Admitting: Internal Medicine

## 2022-08-21 ENCOUNTER — Ambulatory Visit: Payer: BC Managed Care – PPO | Admitting: Internal Medicine

## 2022-08-21 DIAGNOSIS — R1010 Upper abdominal pain, unspecified: Secondary | ICD-10-CM

## 2022-08-21 DIAGNOSIS — R1013 Epigastric pain: Secondary | ICD-10-CM | POA: Diagnosis not present

## 2022-08-21 DIAGNOSIS — F4322 Adjustment disorder with anxiety: Secondary | ICD-10-CM | POA: Diagnosis not present

## 2022-09-13 DIAGNOSIS — F411 Generalized anxiety disorder: Secondary | ICD-10-CM | POA: Diagnosis not present

## 2022-10-11 DIAGNOSIS — F4322 Adjustment disorder with anxiety: Secondary | ICD-10-CM | POA: Diagnosis not present

## 2022-10-12 ENCOUNTER — Telehealth: Payer: BC Managed Care – PPO | Admitting: Family Medicine

## 2022-10-12 ENCOUNTER — Encounter: Payer: Self-pay | Admitting: Family Medicine

## 2022-10-12 DIAGNOSIS — U071 COVID-19: Secondary | ICD-10-CM

## 2022-10-12 DIAGNOSIS — J3489 Other specified disorders of nose and nasal sinuses: Secondary | ICD-10-CM

## 2022-10-12 MED ORDER — NIRMATRELVIR/RITONAVIR (PAXLOVID)TABLET
3.0000 | ORAL_TABLET | Freq: Two times a day (BID) | ORAL | 0 refills | Status: AC
Start: 2022-10-12 — End: 2022-10-17

## 2022-10-12 NOTE — Progress Notes (Signed)
MyChart Video Visit    Virtual Visit via Video Note    Patient location: Home. Patient and provider in visit Provider location: Office  I discussed the limitations of evaluation and management by telemedicine and the availability of in person appointments. The patient expressed understanding and agreed to proceed.  Visit Date: 10/12/2022  Today's healthcare provider: Hetty Blend, NP-C     Subjective:    Patient ID: Erin Stokes, female    DOB: Aug 10, 1960, 62 y.o.   MRN: [id number]  Chief Complaint  Patient presents with   Covid Positive    This morning     HPI  States she tested positive for Covid this morning.  Symptom onset yesterday. Low grade fever, chills, rhinorrhea, sinus irritation.    No body aches, dizziness, cough, chest pain, shortness of breath, N/V/D.      Past Medical History:  Diagnosis Date   Allergic rhinitis, cause unspecified    Endometrial polyp    Family history of ischemic heart disease    History of basal cell carcinoma excision    nose 2013   Irregular menstrual cycle    Wears glasses     Past Surgical History:  Procedure Laterality Date   basal cell cancer     removed from nose in October 2013   DENTAL SURGERY     implants x 2   DILATATION & CURETTAGE/HYSTEROSCOPY WITH MYOSURE N/A 07/16/2014   Procedure: DILATATION & CURETTAGE/HYSTEROSCOPY WITH MYOSURE;  Surgeon: Richardean Chimera, MD;  Location: [facility];  Service: Gynecology;  Laterality: N/A;   HYMENECTOMY     as a teenager   WRIST SURGERY Right 03/08/2017    Family History  Problem Relation Age of Onset   Allergic rhinitis Mother    Thyroid disease Mother    Bladder Cancer Mother    Arthritis Mother    Allergic rhinitis Father    Diabetes Father    Arthritis Father    Heart disease Father    Thyroid cancer Sister    Arthritis Sister    Breast cancer Maternal Grandmother        in 33's   Food Allergy Son    Allergic rhinitis Son    Colon  cancer Neg Hx    Esophageal cancer Neg Hx    Rectal cancer Neg Hx    Stomach cancer Neg Hx     Social History   Socioeconomic History   Marital status: Married    Spouse name: Not on file   Number of children: Not on file   Years of education: Not on file   Highest education level: Bachelor's degree (e.g., BA, AB, BS)  Occupational History   Not on file  Tobacco Use   Smoking status: Never    Passive exposure: Never   Smokeless tobacco: Never  Vaping Use   Vaping status: Never Used  Substance and Sexual Activity   Alcohol use: Yes    Alcohol/week: 14.0 standard drinks of alcohol    Types: 14 Glasses of wine per week    Comment: 2 drinks per day   Drug use: No   Sexual activity: Yes  Other Topics Concern   Not on file  Social History Narrative   Not on file   Social Determinants of Health   Financial Resource Strain: Low Risk  (08/11/2022)   Overall Financial Resource Strain (CARDIA)    Difficulty of Paying Living Expenses: Not hard at all  Food Insecurity: No Food Insecurity (08/11/2022)  Hunger Vital Sign    Worried About Running Out of Food in the Last Year: Never true    Ran Out of Food in the Last Year: Never true  Transportation Needs: No Transportation Needs (08/11/2022)   PRAPARE - Administrator, Civil Service (Medical): No    Lack of Transportation (Non-Medical): No  Physical Activity: Sufficiently Active (08/11/2022)   Exercise Vital Sign    Days of Exercise per Week: 6 days    Minutes of Exercise per Session: 40 min  Stress: No Stress Concern Present (08/11/2022)   [employer] of Occupational Health - Occupational Stress Questionnaire    Feeling of Stress : Only a little  Social Connections: Socially Integrated (08/11/2022)   Social Connection and Isolation Panel [NHANES]    Frequency of Communication with Friends and Family: More than three times a week    Frequency of Social Gatherings with Friends and Family: More than three times a week     Attends Religious Services: 1 to 4 times per year    Active Member of [employer] or Organizations: Yes    Attends Engineer, structural: More than 4 times per year    Marital Status: Married  Catering manager Violence: Not on file    Outpatient Medications Prior to Visit  Medication Sig Dispense Refill   Ascorbic Acid (VITAMIN C PO) Take 500 mg by mouth daily.     CALCIUM PO Take by mouth. With magnesium     cetirizine (ZYRTEC) 10 MG tablet Take 10 mg by mouth daily.     Cholecalciferol (VITAMIN D-3 PO) Take 1,000 Units by mouth daily.     ipratropium (ATROVENT) 0.03 % nasal spray USE 1 SPRAY IN EACH NOSTRIL THREE TIMES DAILY 30 mL 6   Multiple Vitamins-Minerals (MULTIVITAMIN PO) Take by mouth daily.     No facility-administered medications prior to visit.    Allergies  Allergen Reactions   Cortisone     Cortisone inj caused atrophy, skin deterioration   Celebrex [Celecoxib] Rash    Able to take advil ,ibuprofen without problems    ROS     Objective:    Physical Exam  LMP 07/09/2012  Wt Readings from Last 3 Encounters:  08/14/22 119 lb (54 kg)  04/19/22 121 lb 9.6 oz (55.2 kg)  02/23/21 120 lb 9.6 oz (54.7 kg)   Alert and oriented and in no acute distress.  Respirations unlabored.  Speaking in complete sentences without difficulty.  Normal speech and mood.    Assessment & Plan:   Problem List Items Addressed This Visit   None Visit Diagnoses     COVID-19 virus infection    -  Primary   Relevant Medications   nirmatrelvir/ritonavir (PAXLOVID) 20 x 150 MG & 10 x 100MG  TABS   Rhinorrhea          We discussed symptomatic treatment.  Paxlovid prescribed in case she is worsening but she will hold off starting the medication for now.  Discussed how to take the medication and potential side effects.  Follow-up as needed  I am having Erin Stokes start on nirmatrelvir/ritonavir. I am also having her maintain her Multiple Vitamins-Minerals (MULTIVITAMIN PO),  CALCIUM PO, Cholecalciferol (VITAMIN D-3 PO), Ascorbic Acid (VITAMIN C PO), cetirizine, and ipratropium.  Meds ordered this encounter  Medications   nirmatrelvir/ritonavir (PAXLOVID) 20 x 150 MG & 10 x 100MG  TABS    Sig: Take 3 tablets by mouth 2 (two) times daily for 5 days. (Take  nirmatrelvir 150 mg two tablets twice daily for 5 days and ritonavir 100 mg one tablet twice daily for 5 days) Patient GFR is >60    Dispense:  30 tablet    Refill:  0    Order Specific Question:   Supervising Provider    Answer:   Hillard Danker A [[id number]]    I discussed the assessment and treatment plan with the patient. The patient was provided an opportunity to ask questions and all were answered. The patient agreed with the plan and demonstrated an understanding of the instructions.   The patient was advised to call back or seek an in-person evaluation if the symptoms worsen or if the condition fails to improve as anticipated.     Hetty Blend, NP-C [facility] [place] [phone] (phone) [phone] (fax)  [facility]

## 2022-10-13 ENCOUNTER — Ambulatory Visit (AMBULATORY_SURGERY_CENTER): Payer: BC Managed Care – PPO

## 2022-10-13 VITALS — Ht 66.0 in | Wt 118.0 lb

## 2022-10-13 DIAGNOSIS — Z1211 Encounter for screening for malignant neoplasm of colon: Secondary | ICD-10-CM

## 2022-10-13 MED ORDER — NA SULFATE-K SULFATE-MG SULF 17.5-3.13-1.6 GM/177ML PO SOLN
1.0000 | Freq: Once | ORAL | 0 refills | Status: AC
Start: 2022-10-13 — End: 2022-10-13

## 2022-10-13 NOTE — Progress Notes (Signed)
No egg or soy allergy known to patient  No issues known to pt with past sedation with any surgeries or procedures Patient denies ever being told they had issues or difficulty with intubation  No FH of Malignant Hyperthermia Pt is not on diet pills Pt is not on  home 02  Pt is not on blood thinners  Pt denies issues with constipation , takes senekot No A fib or A flutter Have any cardiac testing pending--no Pt instructed to use [email or web address] or GoodRx for a price reduction on prep

## 2022-10-17 ENCOUNTER — Encounter: Payer: Self-pay | Admitting: Gastroenterology

## 2022-10-24 ENCOUNTER — Encounter: Payer: Self-pay | Admitting: Gastroenterology

## 2022-11-07 ENCOUNTER — Ambulatory Visit (AMBULATORY_SURGERY_CENTER): Payer: BC Managed Care – PPO | Admitting: Gastroenterology

## 2022-11-07 ENCOUNTER — Encounter: Payer: Self-pay | Admitting: Gastroenterology

## 2022-11-07 VITALS — BP 102/59 | HR 58 | Temp 97.5°F | Resp 11 | Ht 66.0 in | Wt 118.0 lb

## 2022-11-07 DIAGNOSIS — Z1211 Encounter for screening for malignant neoplasm of colon: Secondary | ICD-10-CM | POA: Diagnosis not present

## 2022-11-07 DIAGNOSIS — K635 Polyp of colon: Secondary | ICD-10-CM | POA: Diagnosis not present

## 2022-11-07 DIAGNOSIS — D124 Benign neoplasm of descending colon: Secondary | ICD-10-CM | POA: Diagnosis not present

## 2022-11-07 DIAGNOSIS — D123 Benign neoplasm of transverse colon: Secondary | ICD-10-CM

## 2022-11-07 MED ORDER — SODIUM CHLORIDE 0.9 % IV SOLN: 500.0000 mL | Freq: Once | INTRAVENOUS | Status: DC

## 2022-11-07 NOTE — Op Note (Signed)
[facility] Patient Name: Erin Stokes Procedure Date: 11/07/2022 8:55 AM MRN: [id number] Endoscopist: Sherilyn Cooter L. Myrtie Neither , MD, [id number] Age: 62 Referring MD:  Date of Birth: September 05, 1960 Gender: Female Account #: [id number] Procedure:                Colonoscopy Indications:              Screening for colorectal malignant neoplasm                           No polyps on for screening colonoscopy October 2014 Medicines:                Monitored Anesthesia Care Procedure:                Pre-Anesthesia Assessment:                           - Prior to the procedure, a History and Physical                            was performed, and patient medications and                            allergies were reviewed. The patient's tolerance of                            previous anesthesia was also reviewed. The risks                            and benefits of the procedure and the sedation                            options and risks were discussed with the patient.                            All questions were answered, and informed consent                            was obtained. Prior Anticoagulants: The patient has                            taken no anticoagulant or antiplatelet agents. ASA                            Grade Assessment: II - A patient with mild systemic                            disease. After reviewing the risks and benefits,                            the patient was deemed in satisfactory condition to                            undergo the procedure.  After obtaining informed consent, the colonoscope                            was passed under direct vision. Throughout the                            procedure, the patient's blood pressure, pulse, and                            oxygen saturations were monitored continuously. The                            PCF-HQ190L Colonoscope [id number] was introduced                            through the  anus and advanced to the the cecum,                            identified by appendiceal orifice and ileocecal                            valve. The colonoscopy was somewhat difficult due                            to a redundant colon and significant looping.                            Successful completion of the procedure was aided by                            using manual pressure and straightening and                            shortening the scope to obtain bowel loop                            reduction. (Adult colonoscope for next colonoscopy)                            the patient tolerated the procedure well. The                            quality of the bowel preparation was good. The                            ileocecal valve, appendiceal orifice, and rectum                            were photographed. Scope In: 9:08:33 AM Scope Out: 9:36:56 AM Scope Withdrawal Time: 0 hours 20 minutes 3 seconds  Total Procedure Duration: 0 hours 28 minutes 23 seconds  Findings:                 The perianal and digital rectal examinations were  normal.                           Repeat examination of right colon under NBI                            performed.                           The colon (entire examined portion) was redundant.                           A diminutive polyp was found in the transverse                            colon. The polyp was sessile. The polyp was removed                            with a cold snare. Resection and retrieval were                            complete. (Jar 1)                           Two mucous-capped and semi-sessile polyps were                            found in the distal transverse colon. The polyps                            were 8 to 10 mm in size. These polyps were removed                            with a cold snare. Resection and retrieval were                            complete.                           The  exam was otherwise without abnormality on                            direct and retroflexion views.                           There was a small pedunculated lipoma, in the                            distal transverse colon. (Seen on prior exam as                            well, similar appearance) Complications:            No immediate complications. Estimated Blood Loss:     Estimated blood loss was minimal. Impression:               -  Redundant colon.                           - One diminutive polyp in the transverse colon,                            removed with a cold snare. Resected and retrieved.                           - Two 8 to 10 mm polyps in the distal transverse                            colon, removed with a cold snare. Resected and                            retrieved.                           - The examination was otherwise normal on direct                            and retroflexion views. Recommendation:           - Patient has a contact number available for                            emergencies. The signs and symptoms of potential                            delayed complications were discussed with the                            patient. Return to normal activities tomorrow.                            Written discharge instructions were provided to the                            patient.                           - Resume previous diet.                           - Continue present medications.                           - Await pathology results.                           - Repeat colonoscopy is recommended for                            surveillance. The colonoscopy date will be                            determined after  pathology results from today's                            exam become available for review. Annelle Behrendt L. Myrtie Neither, MD 11/07/2022 9:44:11 AM This report has been signed electronically.

## 2022-11-07 NOTE — Progress Notes (Signed)
Vss nad trans to pacu 

## 2022-11-07 NOTE — Patient Instructions (Addendum)

## 2022-11-07 NOTE — Progress Notes (Signed)
History and Physical:  This patient presents for endoscopic testing for: Encounter Diagnosis  Name Primary?   Special screening for malignant neoplasms, colon Yes    No polyps last colonoscopy Oct 2014 Patient denies chronic abdominal pain, rectal bleeding, constipation or diarrhea.   Patient is otherwise without complaints or active issues today.   Past Medical History: Past Medical History:  Diagnosis Date   Allergic rhinitis, cause unspecified    Allergy    Arthritis    Cancer (HCC)    Endometrial polyp    Family history of ischemic heart disease    History of basal cell carcinoma excision    nose 2013   Irregular menstrual cycle    Wears glasses      Past Surgical History: Past Surgical History:  Procedure Laterality Date   basal cell cancer     removed from nose in October 2013   DENTAL SURGERY     implants x 2   DILATATION & CURETTAGE/HYSTEROSCOPY WITH MYOSURE N/A 07/16/2014   Procedure: DILATATION & CURETTAGE/HYSTEROSCOPY WITH MYOSURE;  Surgeon: Richardean Chimera, MD;  Location: [facility];  Service: Gynecology;  Laterality: N/A;   HYMENECTOMY     as a teenager   TRIGGER FINGER RELEASE Bilateral 2021   wring finger  2021 & 2024   WRIST SURGERY Right 03/08/2017    Allergies: Allergies  Allergen Reactions   Celebrex [Celecoxib] Rash    Able to take advil ,ibuprofen without problems    Outpatient Meds: Current Outpatient Medications  Medication Sig Dispense Refill   Ascorbic Acid (VITAMIN C PO) Take 500 mg by mouth daily.     CALCIUM PO Take by mouth. With magnesium     Cholecalciferol (VITAMIN D-3 PO) Take 1,000 Units by mouth daily.     ipratropium (ATROVENT) 0.03 % nasal spray USE 1 SPRAY IN EACH NOSTRIL THREE TIMES DAILY 30 mL 6   loratadine (CLARITIN) 10 MG tablet Take 10 mg by mouth daily.     Multiple Vitamins-Minerals (MULTIVITAMIN PO) Take by mouth daily.     Turmeric 400 MG CAPS Take by mouth.     Zinc 50 MG TABS Take by mouth.      Current Facility-Administered Medications  Medication Dose Route Frequency Provider Last Rate Last Admin   0.9 %  sodium chloride infusion  500 mL Intravenous Once Danis, Andreas Blower, MD          ___________________________________________________________________ Objective   Exam:  BP 98/69   Pulse (!) 52   Temp (!) 97.5 F (36.4 C) (Temporal)   Ht 5\' 6"  (1.676 m)   Wt 118 lb (53.5 kg)   LMP 07/09/2012   SpO2 99%   BMI 19.05 kg/m   CV: regular , S1/S2 Resp: clear to auscultation bilaterally, normal RR and effort noted GI: soft, no tenderness, with active bowel sounds.   Assessment: Encounter Diagnosis  Name Primary?   Special screening for malignant neoplasms, colon Yes     Plan: Colonoscopy   The benefits and risks of the planned procedure were described in detail with the patient or (when appropriate) their health care proxy.  Risks were outlined as including, but not limited to, bleeding, infection, perforation, adverse medication reaction leading to cardiac or pulmonary decompensation, pancreatitis (if ERCP).  The limitation of incomplete mucosal visualization was also discussed.  No guarantees or warranties were given.  The patient is appropriate for an endoscopic procedure in the ambulatory setting.   - Erin Jupiter, MD

## 2022-11-07 NOTE — Progress Notes (Signed)
Pt's states no medical or surgical changes since previsit or office visit. 

## 2022-11-07 NOTE — Progress Notes (Signed)
Called to room to assist during endoscopic procedure.  Patient ID and intended procedure confirmed with present staff. Received instructions for my participation in the procedure from the performing physician.  

## 2022-11-08 ENCOUNTER — Telehealth: Payer: Self-pay

## 2022-11-08 NOTE — Telephone Encounter (Signed)
  Follow up Call-     11/07/2022    8:19 AM  Call back number  Post procedure Call Back phone  # [phone]  Permission to leave phone message Yes     Patient questions:  Do you have a fever, pain , or abdominal swelling? No. Pain Score  0 *  Have you tolerated food without any problems? Yes.    Have you been able to return to your normal activities? Yes.    Do you have any questions about your discharge instructions: Diet   No. Medications  No. Follow up visit  No.  Do you have questions or concerns about your Care? No.  Actions: * If pain score is 4 or above: No action needed, pain <4.

## 2022-11-09 ENCOUNTER — Encounter: Payer: Self-pay | Admitting: Gastroenterology

## 2022-11-09 DIAGNOSIS — F4322 Adjustment disorder with anxiety: Secondary | ICD-10-CM | POA: Diagnosis not present

## 2022-11-09 LAB — SURGICAL PATHOLOGY

## 2022-11-10 ENCOUNTER — Telehealth: Payer: Self-pay | Admitting: Gastroenterology

## 2022-11-10 NOTE — Telephone Encounter (Signed)
Patient requested pathology letter that was mailed, sent to her MyChart.

## 2022-11-14 ENCOUNTER — Encounter: Payer: Self-pay | Admitting: Gastroenterology

## 2022-12-04 DIAGNOSIS — L718 Other rosacea: Secondary | ICD-10-CM | POA: Diagnosis not present

## 2022-12-04 DIAGNOSIS — L218 Other seborrheic dermatitis: Secondary | ICD-10-CM | POA: Diagnosis not present

## 2022-12-04 DIAGNOSIS — Z85828 Personal history of other malignant neoplasm of skin: Secondary | ICD-10-CM | POA: Diagnosis not present

## 2023-01-01 DIAGNOSIS — M25562 Pain in left knee: Secondary | ICD-10-CM | POA: Diagnosis not present

## 2023-01-01 DIAGNOSIS — M7122 Synovial cyst of popliteal space [Baker], left knee: Secondary | ICD-10-CM | POA: Diagnosis not present

## 2023-01-26 DIAGNOSIS — F411 Generalized anxiety disorder: Secondary | ICD-10-CM | POA: Diagnosis not present

## 2023-02-23 DIAGNOSIS — F4322 Adjustment disorder with anxiety: Secondary | ICD-10-CM | POA: Diagnosis not present

## 2023-03-05 DIAGNOSIS — Z6820 Body mass index (BMI) 20.0-20.9, adult: Secondary | ICD-10-CM | POA: Diagnosis not present

## 2023-03-05 DIAGNOSIS — Z1151 Encounter for screening for human papillomavirus (HPV): Secondary | ICD-10-CM | POA: Diagnosis not present

## 2023-03-05 DIAGNOSIS — Z01419 Encounter for gynecological examination (general) (routine) without abnormal findings: Secondary | ICD-10-CM | POA: Diagnosis not present

## 2023-03-05 DIAGNOSIS — Z124 Encounter for screening for malignant neoplasm of cervix: Secondary | ICD-10-CM | POA: Diagnosis not present

## 2023-03-06 DIAGNOSIS — Z85828 Personal history of other malignant neoplasm of skin: Secondary | ICD-10-CM | POA: Diagnosis not present

## 2023-03-06 DIAGNOSIS — L718 Other rosacea: Secondary | ICD-10-CM | POA: Diagnosis not present

## 2023-03-06 DIAGNOSIS — L218 Other seborrheic dermatitis: Secondary | ICD-10-CM | POA: Diagnosis not present

## 2023-03-26 DIAGNOSIS — F4322 Adjustment disorder with anxiety: Secondary | ICD-10-CM | POA: Diagnosis not present

## 2023-04-08 ENCOUNTER — Encounter: Payer: Self-pay | Admitting: Internal Medicine

## 2023-04-11 ENCOUNTER — Other Ambulatory Visit: Payer: Self-pay

## 2023-04-11 DIAGNOSIS — Z0000 Encounter for general adult medical examination without abnormal findings: Secondary | ICD-10-CM

## 2023-04-11 NOTE — Telephone Encounter (Signed)
 Patient called back and clarified that she wants a lab appointment scheduled a week before her CPE on 4/22. Please advise patient when labs are ordered for her to complete them prior to her physical.

## 2023-04-17 ENCOUNTER — Other Ambulatory Visit (INDEPENDENT_AMBULATORY_CARE_PROVIDER_SITE_OTHER)

## 2023-04-17 DIAGNOSIS — Z0000 Encounter for general adult medical examination without abnormal findings: Secondary | ICD-10-CM

## 2023-04-17 LAB — LIPID PANEL
Cholesterol: 240 mg/dL — ABNORMAL HIGH (ref 0–200)
HDL: 118.3 mg/dL (ref >39.00)
LDL Cholesterol: 111 mg/dL — ABNORMAL HIGH (ref 0–99)
NonHDL: 121.35
Total CHOL/HDL Ratio: 2
Triglycerides: 52 mg/dL (ref 0.0–149.0)
VLDL: 10.4 mg/dL (ref 0.0–40.0)

## 2023-04-17 LAB — COMPREHENSIVE METABOLIC PANEL WITH GFR
ALT: 24 U/L (ref 0–35)
AST: 23 U/L (ref 0–37)
Albumin: 4.6 g/dL (ref 3.5–5.2)
Alkaline Phosphatase: 64 U/L (ref 39–117)
BUN: 14 mg/dL (ref 6–23)
CO2: 26 meq/L (ref 19–32)
Calcium: 9.4 mg/dL (ref 8.4–10.5)
Chloride: 106 meq/L (ref 96–112)
Creatinine, Ser: 0.73 mg/dL (ref 0.40–1.20)
GFR: 88.15 mL/min (ref >60.00)
Glucose, Bld: 108 mg/dL — ABNORMAL HIGH (ref 70–99)
Potassium: 4.3 meq/L (ref 3.5–5.1)
Sodium: 140 meq/L (ref 135–145)
Total Bilirubin: 0.8 mg/dL (ref 0.2–1.2)
Total Protein: 7 g/dL (ref 6.0–8.3)

## 2023-04-17 LAB — CBC WITH DIFFERENTIAL/PLATELET
Basophils Absolute: 0 10*3/uL (ref 0.0–0.1)
Basophils Relative: 0.7 % (ref 0.0–3.0)
Eosinophils Absolute: 0.1 10*3/uL (ref 0.0–0.7)
Eosinophils Relative: 1.9 % (ref 0.0–5.0)
HCT: 42.1 % (ref 36.0–46.0)
Hemoglobin: 14.2 g/dL (ref 12.0–15.0)
Lymphocytes Relative: 38.5 % (ref 12.0–46.0)
Lymphs Abs: 2.1 10*3/uL (ref 0.7–4.0)
MCHC: 33.7 g/dL (ref 30.0–36.0)
MCV: 97 fl (ref 78.0–100.0)
Monocytes Absolute: 0.4 10*3/uL (ref 0.1–1.0)
Monocytes Relative: 7.8 % (ref 3.0–12.0)
Neutro Abs: 2.8 10*3/uL (ref 1.4–7.7)
Neutrophils Relative %: 51.1 % (ref 43.0–77.0)
Platelets: 241 10*3/uL (ref 150.0–400.0)
RBC: 4.34 Mil/uL (ref 3.87–5.11)
RDW: 13.1 % (ref 11.5–15.5)
WBC: 5.5 10*3/uL (ref 4.0–10.5)

## 2023-04-17 LAB — TSH: TSH: 2.66 u[IU]/mL (ref 0.35–5.50)

## 2023-04-24 DIAGNOSIS — F4322 Adjustment disorder with anxiety: Secondary | ICD-10-CM | POA: Diagnosis not present

## 2023-04-25 ENCOUNTER — Encounter: Payer: BC Managed Care – PPO | Admitting: Internal Medicine

## 2023-04-30 ENCOUNTER — Encounter: Payer: Self-pay | Admitting: Internal Medicine

## 2023-04-30 DIAGNOSIS — R739 Hyperglycemia, unspecified: Secondary | ICD-10-CM | POA: Insufficient documentation

## 2023-04-30 NOTE — Patient Instructions (Addendum)
 Blood work was ordered.       Medications changes include :   None    A referral was ordered and someone will call you to schedule an appointment.     Return in about 1 year (around 04/30/2024) for Physical Exam.   Health Maintenance, Female Adopting a healthy lifestyle and getting preventive care are important in promoting health and wellness. Ask your health care provider about: The right schedule for you to have regular tests and exams. Things you can do on your own to prevent diseases and keep yourself healthy. What should I know about diet, weight, and exercise? Eat a healthy diet  Eat a diet that includes plenty of vegetables, fruits, low-fat dairy products, and lean protein. Do not eat a lot of foods that are high in solid fats, added sugars, or sodium. Maintain a healthy weight Body mass index (BMI) is used to identify weight problems. It estimates body fat based on height and weight. Your health care provider can help determine your BMI and help you achieve or maintain a healthy weight. Get regular exercise Get regular exercise. This is one of the most important things you can do for your health. Most adults should: Exercise for at least 150 minutes each week. The exercise should increase your heart rate and make you sweat (moderate-intensity exercise). Do strengthening exercises at least twice a week. This is in addition to the moderate-intensity exercise. Spend less time sitting. Even light physical activity can be beneficial. Watch cholesterol and blood lipids Have your blood tested for lipids and cholesterol at 63 years of age, then have this test every 5 years. Have your cholesterol levels checked more often if: Your lipid or cholesterol levels are high. You are older than 63 years of age. You are at high risk for heart disease. What should I know about cancer screening? Depending on your health history and family history, you may need to have cancer  screening at various ages. This may include screening for: Breast cancer. Cervical cancer. Colorectal cancer. Skin cancer. Lung cancer. What should I know about heart disease, diabetes, and high blood pressure? Blood pressure and heart disease High blood pressure causes heart disease and increases the risk of stroke. This is more likely to develop in people who have high blood pressure readings or are overweight. Have your blood pressure checked: Every 3-5 years if you are 78-30 years of age. Every year if you are 43 years old or older. Diabetes Have regular diabetes screenings. This checks your fasting blood sugar level. Have the screening done: Once every three years after age 24 if you are at a normal weight and have a low risk for diabetes. More often and at a younger age if you are overweight or have a high risk for diabetes. What should I know about preventing infection? Hepatitis B If you have a higher risk for hepatitis B, you should be screened for this virus. Talk with your health care provider to find out if you are at risk for hepatitis B infection. Hepatitis C Testing is recommended for: Everyone born from [birth year] through 1965. Anyone with known risk factors for hepatitis C. Sexually transmitted infections (STIs) Get screened for STIs, including gonorrhea and chlamydia, if: You are sexually active and are younger than 63 years of age. You are older than 63 years of age and your health care provider tells you that you are at risk for this type of infection. Your sexual activity has  changed since you were last screened, and you are at increased risk for chlamydia or gonorrhea. Ask your health care provider if you are at risk. Ask your health care provider about whether you are at high risk for HIV. Your health care provider may recommend a prescription medicine to help prevent HIV infection. If you choose to take medicine to prevent HIV, you should first get tested for HIV. You  should then be tested every 3 months for as long as you are taking the medicine. Pregnancy If you are about to stop having your period (premenopausal) and you may become pregnant, seek counseling before you get pregnant. Take 400 to 800 micrograms (mcg) of folic acid every day if you become pregnant. Ask for birth control (contraception) if you want to prevent pregnancy. Osteoporosis and menopause Osteoporosis is a disease in which the bones lose minerals and strength with aging. This can result in bone fractures. If you are 37 years old or older, or if you are at risk for osteoporosis and fractures, ask your health care provider if you should: Be screened for bone loss. Take a calcium or vitamin D  supplement to lower your risk of fractures. Be given hormone replacement therapy (HRT) to treat symptoms of menopause. Follow these instructions at home: Alcohol use Do not drink alcohol if: Your health care provider tells you not to drink. You are pregnant, may be pregnant, or are planning to become pregnant. If you drink alcohol: Limit how much you have to: 0-1 drink a day. Know how much alcohol is in your drink. In the [place] one drink equals one 12 oz bottle of beer (355 mL), one 5 oz glass of wine (148 mL), or one 1 oz glass of hard liquor (44 mL). Lifestyle Do not use any products that contain nicotine or tobacco. These products include cigarettes, chewing tobacco, and vaping devices, such as e-cigarettes. If you need help quitting, ask your health care provider. Do not use street drugs. Do not share needles. Ask your health care provider for help if you need support or information about quitting drugs. General instructions Schedule regular health, dental, and eye exams. Stay current with your vaccines. Tell your health care provider if: You often feel depressed. You have ever been abused or do not feel safe at home. Summary Adopting a healthy lifestyle and getting preventive care are  important in promoting health and wellness. Follow your health care provider's instructions about healthy diet, exercising, and getting tested or screened for diseases. Follow your health care provider's instructions on monitoring your cholesterol and blood pressure. This information is not intended to replace advice given to you by your health care provider. Make sure you discuss any questions you have with your health care provider. Document Revised: 05/17/2020 Document Reviewed: 05/17/2020 Elsevier Patient Education  2024 [employer]qq

## 2023-04-30 NOTE — Progress Notes (Unsigned)
 Subjective:    Patient ID: Erin Stokes, female    DOB: [birth year], 63 y.o.   MRN: [id number]      HPI Erin Stokes is here for a Physical exam and her chronic medical problems.   Had blood work done previously.   Medications and allergies reviewed with patient and updated if appropriate.  Current Outpatient Medications on File Prior to Visit  Medication Sig Dispense Refill   Ascorbic Acid (VITAMIN C PO) Take 500 mg by mouth daily.     CALCIUM PO Take by mouth. With magnesium     Cholecalciferol (VITAMIN D -3 PO) Take 1,000 Units by mouth daily.     doxycycline  (MONODOX ) 50 MG capsule Take 50 mg by mouth daily.     Hyaluronan (ORTHOVISC) 30 MG/2ML SOSY intra-articular injection      ipratropium (ATROVENT ) 0.03 % nasal spray USE 1 SPRAY IN EACH NOSTRIL THREE TIMES DAILY 30 mL 6   loratadine (CLARITIN) 10 MG tablet Take 10 mg by mouth daily.     Turmeric 400 MG CAPS Take by mouth.     Zinc 50 MG TABS Take by mouth.     No current facility-administered medications on file prior to visit.    Review of Systems  Constitutional:  Negative for fever.  Eyes:  Negative for visual disturbance.  Respiratory:  Negative for cough, shortness of breath and wheezing.   Cardiovascular:  Positive for palpitations (transient a couple of beats). Negative for chest pain and leg swelling.  Gastrointestinal:  Negative for abdominal pain, blood in stool, constipation and diarrhea.       No gerd  Genitourinary:  Negative for dysuria.  Musculoskeletal:  Positive for arthralgias (hands, knees) and neck stiffness. Negative for back pain (stiffness).  Skin:  Negative for rash.  Neurological:  Negative for light-headedness and headaches.  Psychiatric/Behavioral:  Negative for dysphoric mood. The patient is not nervous/anxious.        Objective:   Vitals:   05/01/23 1024  BP: 106/70  Pulse: 72  Temp: 98.1 F (36.7 C)  SpO2: 100%   Filed Weights   05/01/23 1024  Weight: 119 lb (54 kg)    Body mass index is 19.21 kg/m.  BP Readings from Last 3 Encounters:  05/01/23 106/70  11/07/22 (!) 102/59  08/14/22 110/80    Wt Readings from Last 3 Encounters:  05/01/23 119 lb (54 kg)  11/07/22 118 lb (53.5 kg)  10/13/22 118 lb (53.5 kg)       Physical Exam Constitutional: She appears well-developed and well-nourished. No distress.  HENT:  Head: Normocephalic and atraumatic.  Right Ear: External ear normal. Normal ear canal and TM Left Ear: External ear normal.  Normal ear canal and TM Mouth/Throat: Oropharynx is clear and moist.  Eyes: Conjunctivae normal.  Neck: Neck supple. No tracheal deviation present. No thyromegaly present.  No carotid bruit  Cardiovascular: Normal rate, regular rhythm and normal heart sounds.   No murmur heard.  No edema. Pulmonary/Chest: Effort normal and breath sounds normal. No respiratory distress. She has no wheezes. She has no rales.  Breast: deferred   Abdominal: Soft. She exhibits no distension. There is no tenderness.  Lymphadenopathy: She has no cervical adenopathy.  Skin: Skin is warm and dry. She is not diaphoretic.  Psychiatric: She has a normal mood and affect. Her behavior is normal.     Lab Results  Component Value Date   WBC 5.5 04/17/2023   HGB 14.2 04/17/2023   HCT 42.1  04/17/2023   PLT 241.0 04/17/2023   GLUCOSE 108 (H) 04/17/2023   CHOL 240 (H) 04/17/2023   TRIG 52.0 04/17/2023   HDL 118.30 04/17/2023   LDLCALC 111 (H) 04/17/2023   ALT 24 04/17/2023   AST 23 04/17/2023   NA 140 04/17/2023   K 4.3 04/17/2023   CL 106 04/17/2023   CREATININE 0.73 04/17/2023   BUN 14 04/17/2023   CO2 26 04/17/2023   TSH 2.66 04/17/2023   HGBA1C 5.4 05/01/2023   HGBA1C 5.4 05/01/2023   HGBA1C 5.4 (A) 05/01/2023   HGBA1C 5.4 05/01/2023         Assessment & Plan:   Physical exam: Screening blood work  ordered Exercise  regular - weights, walks, pilates, bikes sometimes, sometimes yoga Weight  normal Substance abuse   none   Reviewed recommended immunizations.   Health Maintenance  Topic Date Due   Cervical Cancer Screening (HPV/Pap Cotest)  02/04/2021   COVID-19 Vaccine (5 - 2024-25 season) 05/16/2023 (Originally 09/10/2022)   INFLUENZA VACCINE  08/10/2023   MAMMOGRAM  06/11/2024   DTaP/Tdap/Td (3 - Td or Tdap) 04/27/2025   Colonoscopy  11/06/2025   Hepatitis C Screening  Completed   Zoster Vaccines- Shingrix   Completed   HPV VACCINES  Aged Out   Meningococcal B Vaccine  Aged Out   HIV Screening  Discontinued          See Problem List for Assessment and Plan of chronic medical problems.

## 2023-05-01 ENCOUNTER — Ambulatory Visit (INDEPENDENT_AMBULATORY_CARE_PROVIDER_SITE_OTHER): Payer: BC Managed Care – PPO | Admitting: Internal Medicine

## 2023-05-01 ENCOUNTER — Other Ambulatory Visit: Payer: Self-pay | Admitting: Obstetrics and Gynecology

## 2023-05-01 VITALS — BP 106/70 | HR 72 | Temp 98.1°F | Ht 66.0 in | Wt 119.0 lb

## 2023-05-01 DIAGNOSIS — Z860100 Personal history of colon polyps, unspecified: Secondary | ICD-10-CM | POA: Insufficient documentation

## 2023-05-01 DIAGNOSIS — R002 Palpitations: Secondary | ICD-10-CM | POA: Diagnosis not present

## 2023-05-01 DIAGNOSIS — Z0000 Encounter for general adult medical examination without abnormal findings: Secondary | ICD-10-CM | POA: Diagnosis not present

## 2023-05-01 DIAGNOSIS — M8580 Other specified disorders of bone density and structure, unspecified site: Secondary | ICD-10-CM | POA: Diagnosis not present

## 2023-05-01 DIAGNOSIS — R739 Hyperglycemia, unspecified: Secondary | ICD-10-CM | POA: Diagnosis not present

## 2023-05-01 DIAGNOSIS — Z1231 Encounter for screening mammogram for malignant neoplasm of breast: Secondary | ICD-10-CM

## 2023-05-01 LAB — POCT GLYCOSYLATED HEMOGLOBIN (HGB A1C)
HbA1c POC (<> result, manual entry): 5.4 % (ref 4.0–5.6)
HbA1c, POC (controlled diabetic range): 5.4 % (ref 0.0–7.0)
HbA1c, POC (prediabetic range): 5.4 % — AB (ref 5.7–6.4)
Hemoglobin A1C: 5.4 % (ref 4.0–5.6)

## 2023-05-01 NOTE — Assessment & Plan Note (Signed)
 Chronic Dexa up to date with gyn Continue regular exercise Taking calcium and vitamin D 

## 2023-05-01 NOTE — Assessment & Plan Note (Addendum)
 Fasting glucose elevated and has had occasionally elevated sugars Family history of diabetes-father A1c here today-5.4% Continue regular exercise, healthy diet-she will watch her sugar intake Weight is in the normal range Will monitor sugars annually

## 2023-05-01 NOTE — Assessment & Plan Note (Signed)
 Chronic Transient for a beat or 2 No change over the years Will get EKG to evaluate further Blood work done within normal limits  EKG: NSR @62  bpm, possible LAE, otherwise normal EKG.  No previous EKG for comparison

## 2023-05-22 DIAGNOSIS — F4322 Adjustment disorder with anxiety: Secondary | ICD-10-CM | POA: Diagnosis not present

## 2023-06-07 DIAGNOSIS — M1712 Unilateral primary osteoarthritis, left knee: Secondary | ICD-10-CM | POA: Diagnosis not present

## 2023-06-11 DIAGNOSIS — F411 Generalized anxiety disorder: Secondary | ICD-10-CM | POA: Diagnosis not present

## 2023-06-13 ENCOUNTER — Ambulatory Visit

## 2023-06-14 ENCOUNTER — Ambulatory Visit

## 2023-06-14 DIAGNOSIS — M1712 Unilateral primary osteoarthritis, left knee: Secondary | ICD-10-CM | POA: Diagnosis not present

## 2023-06-15 ENCOUNTER — Ambulatory Visit

## 2023-06-21 ENCOUNTER — Ambulatory Visit
Admission: RE | Admit: 2023-06-21 | Discharge: 2023-06-21 | Disposition: A | Discharge status: Home / Self Care | Source: Ambulatory Visit | Attending: Obstetrics and Gynecology | Admitting: Obstetrics and Gynecology

## 2023-06-21 DIAGNOSIS — Z1231 Encounter for screening mammogram for malignant neoplasm of breast: Secondary | ICD-10-CM

## 2023-06-21 DIAGNOSIS — M1712 Unilateral primary osteoarthritis, left knee: Secondary | ICD-10-CM | POA: Diagnosis not present

## 2023-06-25 ENCOUNTER — Ambulatory Visit: Admitting: Internal Medicine

## 2023-06-25 ENCOUNTER — Encounter: Payer: Self-pay | Admitting: Internal Medicine

## 2023-06-25 ENCOUNTER — Other Ambulatory Visit: Payer: Self-pay

## 2023-06-25 VITALS — BP 104/68 | HR 76 | Temp 97.9°F | Resp 16 | Ht 64.75 in | Wt 118.6 lb

## 2023-06-25 DIAGNOSIS — J3089 Other allergic rhinitis: Secondary | ICD-10-CM | POA: Diagnosis not present

## 2023-06-25 DIAGNOSIS — T781XXA Other adverse food reactions, not elsewhere classified, initial encounter: Secondary | ICD-10-CM | POA: Diagnosis not present

## 2023-06-25 DIAGNOSIS — T781XXD Other adverse food reactions, not elsewhere classified, subsequent encounter: Secondary | ICD-10-CM

## 2023-06-25 DIAGNOSIS — J302 Other seasonal allergic rhinitis: Secondary | ICD-10-CM | POA: Diagnosis not present

## 2023-06-25 DIAGNOSIS — L299 Pruritus, unspecified: Secondary | ICD-10-CM | POA: Diagnosis not present

## 2023-06-25 MED ORDER — NEFFY 2 MG/0.1ML NA SOLN: 1.0000 | NASAL | 1 refills | Status: AC | PRN

## 2023-06-25 NOTE — Progress Notes (Signed)
 FOLLOW UP Date of Service/Encounter:   06/25/2023  Subjective:  Erin Stokes ZOXWRUEAV (DOB: 08-04-1960) is a 63 y.o. female who [facility] in re-evaluation of the following: Allergic rhinitis, NSAID allergy, concern for any food allergies History obtained from: chart review and patient.  For Review, LV was on 03/02/21  with Dr.Kiven Vangilder seen for routine follow-up. See below for summary of history and diagnostics.  ----------------------------------------------------- Pertinent History/Diagnostics:  History of allergic rhinitis on shots 20 years in [place] but unable to continue due to development of wheezing. Unable to tolerate flonase due to it making her feel spacey. Does respond well to atrovent  nasal spray.  Allergy testing 03/02/2021 positive to cats, grasses and borderline weeds on skin, intradermal's positive to dust mites.  Discussed allergy injections with slow build up.  Will leave out cat since she does not have a cat and she feels that this is what caused her to react during her previous AIT. She has had a reaction to celecoxib but tolerates other NSAIDs. --------------------------------------------------- Today presents for follow-up. Discussed the use of AI scribe software for clinical note transcription with the patient, who gave verbal consent to proceed.  History of Present Illness   Erin Stokes is a 63 year old female who presents with new onset allergic reactions to peanuts.  Over the past few months, she has experienced allergic reactions to peanuts, characterized by stomach pain and rhinorrhea following consumption. Initially, she thought these symptoms were due to a medication change but later identified peanuts as the trigger after experiencing immediate rhinorrhea and pruritus of the throat upon eating them on consecutive nights. She does not consume peanuts frequently but had been eating them more often during that period.  She  has no issues with tree nuts such as almonds, cashews, and pistachios. She has a history of seasonal allergies, primarily to grasses and weeds, but not to birch trees. She uses Claritin for allergy management and has previously tried Zyrtec, which was ineffective. She also uses Atrovent  nasal spray in the morning and sometimes in the afternoon if needed.  She mentions a long-standing intolerance to vinegar, which causes rhinorrhea when she eats salads with vinaigrette dressing  She avoids red pepper due to personal dislike rather than an allergic reaction.  She has a history of avoiding cats due to past allergy issues and has discussed allergy shots in the past but opted to avoid cats instead.       All medications reviewed by clinical staff and updated in chart. No new pertinent medical or surgical history except as noted in HPI.  ROS: All others negative except as noted per HPI.   Objective:  BP 104/68 (BP Location: Right Arm, Patient Position: Sitting, Cuff Size: Normal)   Pulse 76   Temp 97.9 F (36.6 C) (Temporal)   Resp 16   Ht 5' 4.75 (1.645 m)   Wt 118 lb 9.6 oz (53.8 kg)   LMP 07/09/2012   SpO2 100%   BMI 19.89 kg/m  Body mass index is 19.89 kg/m. Physical Exam: General Appearance:  Alert, cooperative, no distress, appears stated age  Head:  Normocephalic, without obvious abnormality, atraumatic  Eyes:  Conjunctiva clear, EOM's intact  Ears EACs normal bilaterally and normal TMs bilaterally  Nose: Nares normal, hypertrophic turbinates, normal mucosa, and no visible anterior polyps  Throat: Lips, tongue normal; teeth and gums normal, normal posterior oropharynx  Neck: Supple, symmetrical  Lungs:   clear to auscultation  bilaterally, Respirations unlabored, no coughing  Heart:  regular rate and rhythm and no murmur, Appears well perfused  Extremities: No edema  Skin: Skin color, texture, turgor normal and no rashes or lesions on visualized portions of skin  Neurologic:  No gross deficits   Labs:  Lab Orders  No laboratory test(s) ordered today    Assessment/Plan   Seasonal and perennial allergic rhinitis-at goal - allergy testing 2023 was positive to cats, grasses and borderline to weeds on skin, intradermals were positive to dust mites; avoidance measures below; updating allergy labs today. - continue claritin 10 mg daily - continue atrovent  (ipatropium bromide) nasal spray 0.03% can use 1-2 sprays up to three times daily, titrate based on symptoms/mucosal dryness - if not controlled add Nasacort (triamcinolone ) or Flonase Sensimist 1-2 sprays daily; if side effects recur, discontinue and we will not try steroid nasal sprays again - consider allergy shots on slowest schedule given previous intolerance  Concern for food allergy/intolerance: new problem - avoid peanuts for now - labs today. If negative, confirm with skin. - please strictly avoid peanuts - okay to continue eating tree nuts - for SKIN only reaction, okay to take Benadryl 2 capsules every 4 hours - for SKIN + ANY additional symptoms, OR IF concern for LIFE THREATENING reaction = Epipen  Autoinjector EpiPen  0.3 mg.or Nasal epinephrine (nefffy) - If using Epinephrine  autoinjector, call 911 - A food allergy action plan has been provided and discussed. - Medic Alert identification is recommended.  History of rash following celebrex (celecoxib): stable -avoidance of COX2 specific NSAID inhibitors - okay to use other NSAIDs   Follow-up pending lab results:    It was a pleasure seeing you again in clinic today!  Other: none  Jonathon Neighbors, MD  [facility] 

## 2023-06-25 NOTE — Patient Instructions (Addendum)
 Seasonal and perennial allergic rhinitis - allergy testing 2023 was positive to cats, grasses and borderline to weeds on skin, intradermals were positive to dust mites; avoidance measures below; updating allergy labs today. - continue claritin 10 mg daily - continue atrovent  (ipatropium bromide) nasal spray 0.03% can use 1-2 sprays up to three times daily, titrate based on symptoms/mucosal dryness - if not controlled add Nasacort (triamcinolone ) or Flonase Sensimist 1-2 sprays daily; if side effects recur, discontinue and we will not try steroid nasal sprays again - consider allergy shots on slowest schedule given previous intolerance  Concern for food allergy/intolerance:  - avoid peanuts for now - labs today. If negative, confirm with skin. - please strictly avoid peanuts - okay to continue eating tree nuts - for SKIN only reaction, okay to take Benadryl 2 capsules every 4 hours - for SKIN + ANY additional symptoms, OR IF concern for LIFE THREATENING reaction = Epipen  Autoinjector EpiPen  0.3 mg.or Nasal epinephrine (nefffy) - If using Epinephrine  autoinjector, call 911 - A food allergy action plan has been provided and discussed. - Medic Alert identification is recommended.  History of rash following celebrex (celecoxib):  -avoidance of COX2 specific NSAID inhibitors - okay to use other NSAIDs   Follow-up pending lab results:    It was a pleasure seeing you again in clinic today!  Jonathon Neighbors, MD [facility]  DUST MITE AVOIDANCE MEASURES:  There are three main measures that need and can be taken to avoid house dust mites:  Reduce accumulation of dust in general -reduce furniture, clothing, carpeting, books, stuffed animals, especially in bedroom  Separate yourself from the dust -use pillow and mattress encasements (can be found at stores such as [employer] or online) -avoid direct exposure to air condition flow -use a HEPA filter device,  especially in the bedroom; you can also use a HEPA filter vacuum cleaner -wipe dust with a moist towel instead of a dry towel or broom when cleaning  Decrease mites and/or their secretions -wash clothing and linen and stuffed animals at highest temperature possible, at least every 2 weeks -stuffed animals can also be placed in a bag and put in a freezer overnight  Despite the above measures, it is impossible to eliminate dust mites or their allergen completely from your home.  With the above measures the burden of mites in your home can be diminished, with the goal of minimizing your allergic symptoms.  Success will be reached only when implementing and using all means together.  Control of Dog or Cat Allergen  Avoidance is the best way to manage a dog or cat allergy. If you have a dog or cat and are allergic to dog or cats, consider removing the dog or cat from the home. If you have a dog or cat but don't want to find it a new home, or if your family wants a pet even though someone in the household is allergic, here are some strategies that may help keep symptoms at bay:  Keep the pet out of your bedroom and restrict it to only a few rooms. Be advised that keeping the dog or cat in only one room will not limit the allergens to that room. Don't pet, hug or kiss the dog or cat; if you do, wash your hands with soap and water. High-efficiency particulate air (HEPA) cleaners run continuously in a bedroom or living room can reduce allergen levels over time. Regular use of a high-efficiency vacuum cleaner  or a central vacuum can reduce allergen levels. Giving your dog or cat a bath at least once a week can reduce airborne allergen.  Reducing Pollen Exposure  The American Academy of Allergy, Asthma and Immunology suggests the following steps to reduce your exposure to pollen during allergy seasons.    Do not hang sheets or clothing out to dry; pollen may collect on these items. Do not mow lawns or  spend time around freshly cut grass; mowing stirs up pollen. Keep windows closed at night.  Keep car windows closed while driving. Minimize morning activities outdoors, a time when pollen counts are usually at their highest. Stay indoors as much as possible when pollen counts or humidity is high and on windy days when pollen tends to remain in the air longer. Use air conditioning when possible.  Many air conditioners have filters that trap the pollen spores. Use a HEPA room air filter to remove pollen form the indoor air you breathe.

## 2023-06-26 ENCOUNTER — Other Ambulatory Visit: Payer: Self-pay | Admitting: Internal Medicine

## 2023-06-27 ENCOUNTER — Other Ambulatory Visit: Payer: Self-pay | Admitting: *Deleted

## 2023-06-27 MED ORDER — EPINEPHRINE 0.3 MG/0.3ML IJ SOAJ
0.3000 mg | INTRAMUSCULAR | 1 refills | Status: AC | PRN
Start: 2023-06-27 — End: ?

## 2023-06-27 NOTE — Telephone Encounter (Signed)
 Received fax from [facility]  needs PA. Sent in Epipen  to local pharmacy.

## 2023-06-28 LAB — IGE+ALLERGENS ZONE 2(30)
Alternaria Alternata IgE: <0.1 kU/L
Amer Sycamore IgE Qn: <0.1 kU/L
Aspergillus Fumigatus IgE: <0.1 kU/L
Bahia Grass IgE: <0.1 kU/L
Bermuda Grass IgE: <0.1 kU/L
Cat Dander IgE: 0.3 kU/L — AB
Cedar, Mountain IgE: <0.1 kU/L
Cladosporium Herbarum IgE: <0.1 kU/L
Cockroach, American IgE: <0.1 kU/L
Common Silver Birch IgE: <0.1 kU/L
D Farinae IgE: <0.1 kU/L
D Pteronyssinus IgE: <0.1 kU/L
Dog Dander IgE: <0.1 kU/L
Elm, American IgE: <0.1 kU/L
Hickory, White IgE: <0.1 kU/L
IgE (Immunoglobulin E), Serum: 7 [IU]/mL (ref 6–495)
Johnson Grass IgE: <0.1 kU/L
Maple/Box Elder IgE: <0.1 kU/L
Mucor Racemosus IgE: <0.1 kU/L
Mugwort IgE Qn: <0.1 kU/L
Nettle IgE: <0.1 kU/L
Oak, White IgE: <0.1 kU/L
Penicillium Chrysogen IgE: <0.1 kU/L
Pigweed, Rough IgE: <0.1 kU/L
Plantain, English IgE: <0.1 kU/L
Ragweed, Short IgE: <0.1 kU/L
Sheep Sorrel IgE Qn: <0.1 kU/L
Stemphylium Herbarum IgE: <0.1 kU/L
Sweet gum IgE RAST Ql: <0.1 kU/L
Timothy Grass IgE: <0.1 kU/L
White Mulberry IgE: <0.1 kU/L

## 2023-06-29 ENCOUNTER — Ambulatory Visit: Payer: Self-pay | Admitting: Internal Medicine

## 2023-06-29 LAB — IGE PEANUT COMPONENT PROFILE
F352-IgE Ara h 8: <0.1 kU/L
F422-IgE Ara h 1: <0.1 kU/L
F423-IgE Ara h 2: <0.1 kU/L
F424-IgE Ara h 3: <0.1 kU/L
F427-IgE Ara h 9: <0.1 kU/L
F447-IgE Ara h 6: <0.1 kU/L

## 2023-06-29 NOTE — Progress Notes (Signed)
 Please let Erin Stokes know that her labs returned.  Cat is positive on her environmental allergy testing.  For peanut, all components are negative.  I would recommend skin testing in clinic followed by peanut butter challenge.  Food challenge instructions: You must be off antihistamines for 3-5 days before. Must be in good health and not ill. No vaccines/injections/antibiotics within the past 7 days. Plan on being in the office for 2-4 hours and must bring in the food you want to do the oral challenge for. Let me know if she has any questions or concerns.

## 2023-07-06 ENCOUNTER — Telehealth: Payer: Self-pay

## 2023-07-06 NOTE — Telephone Encounter (Signed)
 Updated Case Required.Closed by Provider

## 2023-07-06 NOTE — Telephone Encounter (Signed)
*  [facility]  Pharmacy Patient Advocate Encounter   Received notification from CoverMyMeds that prior authorization for Neffy  2MG /0.1ML solution  is required/requested.   Insurance verification completed.   The patient is insured through [facility] .   Per test claim: PA required; PA submitted to above mentioned insurance via CoverMyMeds Key/confirmation #/EOC [id number] Status is pending

## 2023-07-09 DIAGNOSIS — Z85828 Personal history of other malignant neoplasm of skin: Secondary | ICD-10-CM | POA: Diagnosis not present

## 2023-07-09 DIAGNOSIS — D2261 Melanocytic nevi of right upper limb, including shoulder: Secondary | ICD-10-CM | POA: Diagnosis not present

## 2023-07-09 DIAGNOSIS — L821 Other seborrheic keratosis: Secondary | ICD-10-CM | POA: Diagnosis not present

## 2023-07-09 DIAGNOSIS — D225 Melanocytic nevi of trunk: Secondary | ICD-10-CM | POA: Diagnosis not present

## 2023-07-23 ENCOUNTER — Telehealth: Payer: Self-pay | Admitting: Family Medicine

## 2023-07-23 ENCOUNTER — Encounter: Payer: Self-pay | Admitting: Internal Medicine

## 2023-07-23 ENCOUNTER — Ambulatory Visit: Admitting: Internal Medicine

## 2023-07-23 DIAGNOSIS — T781XXD Other adverse food reactions, not elsewhere classified, subsequent encounter: Secondary | ICD-10-CM

## 2023-07-23 DIAGNOSIS — L299 Pruritus, unspecified: Secondary | ICD-10-CM

## 2023-07-23 DIAGNOSIS — T781XXA Other adverse food reactions, not elsewhere classified, initial encounter: Secondary | ICD-10-CM

## 2023-07-23 NOTE — Telephone Encounter (Signed)
 Patient has a Peanut  Challenge at 1:30pm with Arlean on Aug 18th.  Best Contact: [phone]  Please mail out challenge protocol to patient.

## 2023-07-23 NOTE — Progress Notes (Signed)
  Date of Service/Encounter:  07/23/23  Allergy  testing appointment   Previous visit on 06/25/23, seen for concern for peanut  allergy  and allergic rhinitis.  Please see that note for additional details.  Today reports for allergy  diagnostic testing:    DIAGNOSTICS:  Skin Testing: Select foods. Adequate positive and negative controls. Results discussed with patient/family.  Food Adult Perc - 07/23/23 1400     Time Antigen Placed 1420    Allergen Manufacturer Jestine    Location Back    Number of allergen test 1     Control-buffer 50% Glycerol Negative    Control-Histamine 3+    1. Peanut  Negative          Allergy  testing results were read and interpreted by myself, documented by clinical staff.  Patient provided with copy of allergy  testing along with avoidance measures when indicated.   Rocky Endow, MD  [facility]

## 2023-07-23 NOTE — Patient Instructions (Addendum)
 Seasonal and perennial allergic rhinitis - allergy  testing 2023 was positive to cats, grasses and borderline to weeds on skin, intradermals were positive to dust mites; avoidance measures below -labs 2025 positive to cat - continue claritin 10 mg daily - continue atrovent  (ipatropium bromide) nasal spray 0.03% can use 1-2 sprays up to three times daily, titrate based on symptoms/mucosal dryness - if not controlled add Nasacort (triamcinolone ) or Flonase Sensimist 1-2 sprays daily; if side effects recur, discontinue and we will not try steroid nasal sprays again - consider allergy  shots on slowest schedule given previous intolerance  Concern for food allergy /intolerance:  Hx of reaction: immediate rhinorrhea and pruritus of the throat upon eating them on consecutive nights  - avoid peanuts for now - labs negative to IgE peanut  component (06/25/23) - skin testing 07/23/23: negative; planning for oral challenge to roasted peanuts  - please strictly avoid peanuts - okay to continue eating tree nuts - for SKIN only reaction, okay to take Benadryl 2 capsules every 4 hours - for SKIN + ANY additional symptoms, OR IF concern for LIFE THREATENING reaction = Epipen  Autoinjector EpiPen  0.3 mg.or Nasal epinephrine (nefffy) - If using Epinephrine  autoinjector, call 911 - A food allergy  action plan has been provided and discussed. - Medic Alert identification is recommended.  History of rash following celebrex (celecoxib):  -avoidance of COX2 specific NSAID inhibitors - okay to use other NSAIDs   Follow up : 6 months, sooner if needed.  It was a pleasure seeing you again in clinic today! Thank you for allowing me to participate in your care.  Rocky Endow, MD [facility]    It was a pleasure seeing you again in clinic today!  Rocky Endow, MD [facility]  DUST MITE AVOIDANCE MEASURES:  There are three main measures that need and can be taken to avoid  house dust mites:  Reduce accumulation of dust in general -reduce furniture, clothing, carpeting, books, stuffed animals, especially in bedroom  Separate yourself from the dust -use pillow and mattress encasements (can be found at stores such as [employer] or online) -avoid direct exposure to air condition flow -use a HEPA filter device, especially in the bedroom; you can also use a HEPA filter vacuum cleaner -wipe dust with a moist towel instead of a dry towel or broom when cleaning  Decrease mites and/or their secretions -wash clothing and linen and stuffed animals at highest temperature possible, at least every 2 weeks -stuffed animals can also be placed in a bag and put in a freezer overnight  Despite the above measures, it is impossible to eliminate dust mites or their allergen completely from your home.  With the above measures the burden of mites in your home can be diminished, with the goal of minimizing your allergic symptoms.  Success will be reached only when implementing and using all means together.  Control of Dog or Cat Allergen  Avoidance is the best way to manage a dog or cat allergy . If you have a dog or cat and are allergic to dog or cats, consider removing the dog or cat from the home. If you have a dog or cat but don't want to find it a new home, or if your family wants a pet even though someone in the household is allergic, here are some strategies that may help keep symptoms at bay:  Keep the pet out of your bedroom and restrict it to only a few  rooms. Be advised that keeping the dog or cat in only one room will not limit the allergens to that room. Don't pet, hug or kiss the dog or cat; if you do, wash your hands with soap and water. High-efficiency particulate air (HEPA) cleaners run continuously in a bedroom or living room can reduce allergen levels over time. Regular use of a high-efficiency vacuum cleaner or a central vacuum can reduce allergen  levels. Giving your dog or cat a bath at least once a week can reduce airborne allergen.  Reducing Pollen Exposure  The American Academy of Allergy , Asthma and Immunology suggests the following steps to reduce your exposure to pollen during allergy  seasons.    Do not hang sheets or clothing out to dry; pollen may collect on these items. Do not mow lawns or spend time around freshly cut grass; mowing stirs up pollen. Keep windows closed at night.  Keep car windows closed while driving. Minimize morning activities outdoors, a time when pollen counts are usually at their highest. Stay indoors as much as possible when pollen counts or humidity is high and on windy days when pollen tends to remain in the air longer. Use air conditioning when possible.  Many air conditioners have filters that trap the pollen spores. Use a HEPA room air filter to remove pollen form the indoor air you breathe.

## 2023-07-24 NOTE — Telephone Encounter (Signed)
 Mailed out peanut  challenge protocol to patient.

## 2023-08-27 ENCOUNTER — Encounter: Admitting: Family Medicine

## 2023-10-09 ENCOUNTER — Encounter: Payer: Self-pay | Admitting: Internal Medicine

## 2023-10-16 NOTE — Progress Notes (Signed)
 [address] [place] [address] Dept: [phone]  FOLLOW UP NOTE  Patient ID: Erin Stokes, female    DOB: 07-07-1960  Age: 63 y.o. MRN: [id number] Date of Office Visit: 10/18/2023  Assessment  Chief Complaint: Food/Drug Challenge  HPI Erin Stokes is a 63 year old female who presents to the clinic for an in the office oral food challenge.  She was last seen in this clinic on 07/23/2023 by Dr. Marinda for evaluation of food allergy  skin testing to peanut .  Prior to that visit she was seen in this clinic on 06/25/2023 by Dr. Marinda for evaluation of allergic rhinitis, rash, food allergy , and possible drug allergy .  Her last food allergy  skin testing was negative to peanut  on 07/23/2023 and lab testing on 06/25/2023 was negative to peanut  and components.   At today's visit, she reports that she is feeling well overall with no cardiopulmonary, gastrointestinal, or integumentary symptoms.  She has not had any antihistamines over the last 3 days.  Her current medications are listed in the chart.  Drug Allergies:  Allergies  Allergen Reactions   Celebrex [Celecoxib] Rash    Able to take advil ,ibuprofen without problems    Physical Exam: BP 102/74   Pulse 72   Temp 98.7 F (37.1 C)   Resp 16   Ht 5' 4.75 (1.645 m)   Wt 121 lb 1.6 oz (54.9 kg)   LMP 07/09/2012   SpO2 99%   BMI 20.31 kg/m    Physical Exam Vitals reviewed.  Constitutional:      Appearance: Normal appearance.  HENT:     Head: Normocephalic and atraumatic.     Nose: Nose normal.     Mouth/Throat:     Pharynx: Oropharynx is clear.  Cardiovascular:     Rate and Rhythm: Normal rate and regular rhythm.     Heart sounds: Normal heart sounds. No murmur heard. Pulmonary:     Effort: Pulmonary effort is normal.     Breath sounds: Normal breath sounds.     Comments: Lungs clear to auscultation Musculoskeletal:        General: Normal range of motion.     Cervical back: Normal range of motion and neck  supple.  Skin:    General: Skin is warm and dry.  Neurological:     Mental Status: She is alert and oriented to person, place, and time.  Psychiatric:        Mood and Affect: Mood normal.        Behavior: Behavior normal.        Thought Content: Thought content normal.        Judgment: Judgment normal.      Procedure note:  Written consent obtained  Open graded peanut  oral challenge: The patient was able to tolerate the challenge today without adverse signs or symptoms. Vital signs were stable throughout the challenge and observation period. She received multiple doses separated by 15 minutes, each of which was separated by vitals and a brief physical exam. She received the following doses: lip rub, 1 gm, 2 gm, 4 gm, 8 gm, and 16 gm. She was monitored for 60 minutes following the last dose.  Total testing time: 142 minutes  The patient had negative skin prick test and sIgE tests to peanut  and was able to tolerate the open graded oral challenge today without adverse signs or symptoms. Therefore, she has the same risk of systemic reaction associated with the consumption of peanut  as the general  population.   Assessment and Plan: 1. Peanut -induced anaphylaxis, subsequent encounter     Patient Instructions  In the office oral peanut  challenge Erin Stokes was able to tolerate the peanut  butter food challenge today at the office without adverse signs or symptoms of an allergic reaction. Therefore, she has the same risk of systemic reaction associated with the consumption of peanut  as the general population.  - Do not give any peanut  or products containing peanut  for the next 24 hours. - Monitor for allergic symptoms such as rash, wheezing, diarrhea, swelling, and vomiting for the next 24 hours. If severe symptoms occur, treat with EpiPen  injection and call 911. For less severe symptoms treat with cetirizine 10 mg every 12-24 hours and call the clinic.  - If no allergic symptoms are  evident, reintroduce peanut  into the diet. If you develop an allergic reaction to peanut , record what was eaten the amount eaten, preparation method, time from ingestion to reaction, and symptoms.   Call the clinic if this treatment plan is not working well for you  Follow up in 6 months or sooner if needed.   Return in about 6 months (around 04/17/2024), or if symptoms worsen or fail to improve.    Thank you for the opportunity to care for this patient.  Please do not hesitate to contact me with questions.  Arlean Mutter, FNP [facility] 

## 2023-10-16 NOTE — Patient Instructions (Signed)
 In the office oral peanut  challenge Amijah Timothy was able to tolerate the peanut  butter food challenge today at the office without adverse signs or symptoms of an allergic reaction. Therefore, she has the same risk of systemic reaction associated with the consumption of peanut  as the general population.  - Do not give any peanut  or products containing peanut  for the next 24 hours. - Monitor for allergic symptoms such as rash, wheezing, diarrhea, swelling, and vomiting for the next 24 hours. If severe symptoms occur, treat with EpiPen  injection and call 911. For less severe symptoms treat with cetirizine 10 mg every 12-24 hours and call the clinic.  - If no allergic symptoms are evident, reintroduce peanut  into the diet. If you develop an allergic reaction to peanut , record what was eaten the amount eaten, preparation method, time from ingestion to reaction, and symptoms.   Call the clinic if this treatment plan is not working well for you  Follow up in 6 months or sooner if needed.

## 2023-10-18 ENCOUNTER — Ambulatory Visit (INDEPENDENT_AMBULATORY_CARE_PROVIDER_SITE_OTHER): Admitting: Family Medicine

## 2023-10-18 ENCOUNTER — Other Ambulatory Visit: Payer: Self-pay

## 2023-10-18 ENCOUNTER — Encounter: Payer: Self-pay | Admitting: Family Medicine

## 2023-10-18 VITALS — BP 102/74 | HR 72 | Temp 98.7°F | Resp 16 | Ht 64.75 in | Wt 121.1 lb

## 2023-10-18 DIAGNOSIS — T7801XD Anaphylactic reaction due to peanuts, subsequent encounter: Secondary | ICD-10-CM

## 2023-10-18 DIAGNOSIS — T7801XA Anaphylactic reaction due to peanuts, initial encounter: Secondary | ICD-10-CM | POA: Insufficient documentation

## 2023-10-19 NOTE — Addendum Note (Signed)
 Addended by: NANCEE JON SAILOR on: 10/19/2023 10:16 AM   Modules accepted: Orders

## 2023-11-09 DIAGNOSIS — M1712 Unilateral primary osteoarthritis, left knee: Secondary | ICD-10-CM | POA: Diagnosis not present

## 2023-12-10 DIAGNOSIS — H9042 Sensorineural hearing loss, unilateral, left ear, with unrestricted hearing on the contralateral side: Secondary | ICD-10-CM | POA: Diagnosis not present

## 2023-12-20 DIAGNOSIS — M1712 Unilateral primary osteoarthritis, left knee: Secondary | ICD-10-CM | POA: Diagnosis not present

## 2024-01-07 ENCOUNTER — Telehealth: Payer: Self-pay

## 2024-01-07 NOTE — Telephone Encounter (Signed)
 Copied from CRM [id number]. Topic: Referral - Request for Referral >> Jan 07, 2024 11:57 AM Tiffini S wrote: Did the patient discuss referral with their provider in the last year? No (If No - schedule appointment)[facility]   (If Yes - send message)  Appointment offered? Yes  Type of order/referral and detailed reason for visit: lump in left thigh- vein is collecting blood- look likes a broken blood vessel  Preference of office, provider, location:   [facility] at [facility] [address] [place], [place] [address] [phone]  If referral order, have you been seen by this specialty before? Yes (If Yes, this issue or another issue? When? Where?  Can we respond through MyChart? Yes

## 2024-01-17 ENCOUNTER — Encounter: Payer: Self-pay | Admitting: Internal Medicine

## 2024-01-17 NOTE — Patient Instructions (Addendum)
" ° ° ° °  Medications changes include :   None    A referral was ordered vascular surgery  and an ultrasound was ordered and someone will call you to schedule an appointment.    "

## 2024-01-17 NOTE — Progress Notes (Signed)
" ° ° °  Subjective:    Patient ID: Erin Stokes, female    DOB: [birth year], 64 y.o.   MRN: [id number]      HPI Erin Stokes is here for No chief complaint on file.  She has a lump under her skin on her left leg has been there for a while, but something that has concerned her and is annoying her.  Her dermatologist did not feel it was a cyst.  She thinks she would like to have it evaluated further and removed.  Her dermatologist recommended possibly seeing vascular.  It is not tender.  There is no obvious change in size.    Medications and allergies reviewed with patient and updated if appropriate.  Medications Ordered Prior to Encounter[1]  Review of Systems     Objective:   Vitals:   01/18/24 1000  BP: 110/68  Pulse: 71  Temp: 98.7 F (37.1 C)  SpO2: 98%   BP Readings from Last 3 Encounters:  01/18/24 110/68  10/18/23 102/74  06/25/23 104/68   Wt Readings from Last 3 Encounters:  01/18/24 119 lb (54 kg)  10/18/23 121 lb 1.6 oz (54.9 kg)  06/25/23 118 lb 9.6 oz (53.8 kg)   Body mass index is 19.8 kg/m.    Physical Exam Constitutional:      General: She is not in acute distress.    Appearance: Normal appearance. She is not ill-appearing.  HENT:     Head: Normocephalic and atraumatic.  Skin:    General: Skin is dry.     Comments: Superficial pea-sized lump left distal lateral leg-nontender, mobile  Neurological:     Mental Status: She is alert.            Assessment & Plan:    See Problem List for Assessment and Plan of chronic medical problems.         [1]  Current Outpatient Medications on File Prior to Visit  Medication Sig Dispense Refill   Ascorbic Acid (VITAMIN C PO) Take 500 mg by mouth daily.     CALCIUM PO Take by mouth. With magnesium     Cholecalciferol (VITAMIN D -3 PO) Take 1,000 Units by mouth daily.     doxycycline  (MONODOX ) 50 MG capsule Take 50 mg by mouth daily.     EPINEPHrine  (EPIPEN  2-PAK) 0.3 mg/0.3 mL IJ SOAJ injection  Inject 0.3 mg into the muscle as needed. 2 each 1   EPINEPHrine  (NEFFY ) 2 MG/0.1ML SOLN Place 1 spray into the nose as needed (anaphylaxis). 3 each 1   Hyaluronan (ORTHOVISC) 30 MG/2ML SOSY intra-articular injection      ipratropium (ATROVENT ) 0.03 % nasal spray USE 1 SPRAY IN EACH NOSTRIL THREE TIMES DAILY 30 mL 6   loratadine (CLARITIN) 10 MG tablet Take 10 mg by mouth daily.     Turmeric 400 MG CAPS Take by mouth.     Zinc 50 MG TABS Take by mouth.     No current facility-administered medications on file prior to visit.   "

## 2024-01-18 ENCOUNTER — Ambulatory Visit: Admitting: Internal Medicine

## 2024-01-18 VITALS — BP 110/68 | HR 71 | Temp 98.7°F | Ht 65.0 in | Wt 119.0 lb

## 2024-01-18 DIAGNOSIS — R739 Hyperglycemia, unspecified: Secondary | ICD-10-CM

## 2024-01-18 DIAGNOSIS — R2242 Localized swelling, mass and lump, left lower limb: Secondary | ICD-10-CM | POA: Insufficient documentation

## 2024-01-18 DIAGNOSIS — M8580 Other specified disorders of bone density and structure, unspecified site: Secondary | ICD-10-CM

## 2024-01-18 NOTE — Assessment & Plan Note (Signed)
 Chronic Has had a skin lump in her left upper leg in the lateral region for a while It has bothered her and she would like to have it further evaluated and likely removed Dermatology did not think it was a cyst and thought it might be vascular It is nontender, mobile, has not increased in size ?  Cyst, lipoma, varicosity Ultrasound to evaluate Referral to vascular just in case it is vascular since their wait time to be seen is quite lengthy

## 2024-01-29 ENCOUNTER — Ambulatory Visit (HOSPITAL_BASED_OUTPATIENT_CLINIC_OR_DEPARTMENT_OTHER)
Admission: RE | Admit: 2024-01-29 | Discharge: 2024-01-29 | Disposition: A | Discharge status: Home / Self Care | Source: Ambulatory Visit | Attending: Internal Medicine | Admitting: Internal Medicine

## 2024-01-29 DIAGNOSIS — R2242 Localized swelling, mass and lump, left lower limb: Secondary | ICD-10-CM | POA: Diagnosis present

## 2024-02-04 ENCOUNTER — Ambulatory Visit: Payer: Self-pay | Admitting: Internal Medicine

## 2024-02-04 DIAGNOSIS — D1724 Benign lipomatous neoplasm of skin and subcutaneous tissue of left leg: Secondary | ICD-10-CM

## 2024-02-15 ENCOUNTER — Ambulatory Visit: Payer: Self-pay

## 2024-02-15 ENCOUNTER — Telehealth: Payer: Self-pay

## 2024-02-15 NOTE — Telephone Encounter (Signed)
 Copied from CRM [id number]. Topic: Clinical - Medical Advice >> Feb 15, 2024  9:53 AM Vena HERO wrote: Reason for CRM: Pt has symptoms of cold/sinus for 2-3 days and is already taking antihistamines and would like to know if there is anything else should could be taking to help symptoms of runny nose before it gets worse. Please cal pt to advise

## 2024-02-15 NOTE — Telephone Encounter (Signed)
 FYI Only or Action Required?: Action required by provider: update on patient condition and patient called requesting care advice and would like provider to be aware of recent mold remediation in home, see notes, declines appointment.  Patient was last seen in primary care on 01/18/2024 by Geofm Glade PARAS, MD.  Called Nurse Triage reporting Nasal Congestion (Nasal drainage, sneezing, itching eyes. (No congestion). ).  Symptoms began several days ago.  Interventions attempted: OTC medications: Claritin, Prescription medications: atrovent  nasal spray, nasal saline gel, and Rest, hydration, or home remedies.  Symptoms are: stable.  Triage Disposition: Home Care  Patient/caregiver understands and will follow disposition?: Yes   Message from Boaz F sent at 02/15/2024  4:11 PM EST  Reason for Triage: Patient having extreme runny nose, itchy eyes, sneezing - tested negative for covid/flu/rsv, and is already taking Antihistamines and would like to know if there another medication she can take before it gets worse. Please call  [phone]   Reason for Disposition  [1] Nasal allergies AND [2] only certain times of year (hay fever) AND [3] hay fever diagnosis has been confirmed by a doctor (or NP/PA)  Answer Assessment - Initial Assessment Questions 1. SYMPTOM: What's the main symptom you're concerned about? (e.g., runny nose, stuffiness, sneezing, itching)     Runny noes- constant; sneezing- occasional; itchy eyes- comes/ goes, no redness nor drainage to eyes; not congested- took Pseudofed just to see if this would help but denies congestion.    2. SEVERITY: How bad is it? What does it keep you from doing? (e.g., sleeping, working)      Runny nose is constant.  Has been able to sleep.   3. EYES: Are your eyes also red, watery, and itchy?      itchy  4. TRIGGER: What pollen or other allergic substance do you think is causing the symptoms?      This time of year, dust and dryness.  Not around cats. Sometime trees/plants in warmer weather.  Home is dry.   5. TREATMENT: What medicine are you using? What medicine worked best in the past?     Currently using Claritin.  Takes Claritin every morning, history of allergies; took pseudofed- worked a little, not a lot;  sometimes using saline nasal gel;  irpatropium bromide nasal spray 1-2 x /day. Does not tolerate Flonase- makes nauseas.   6. OTHER SYMPTOMS: Do you have any other symptoms? (e.g., coughing, difficulty breathing, wheezing)     No cough, no fever or chills, no pain.  No congestion.  COVID/Flu test negative today.  Symptoms started two days ago.  Husband had influenza a week ago but has been fever free for a week and isolated.  Patient feels her symptoms are more cold related and not the flu.   7. EXPOSURES:  Patient reports home was remediated a few months ago for mold, down to the studs.  HVAC unit itself was replaced but not all the ductwork.  Has not seen any visible mold;  however, no post-remediation dust test was completed.  Patient also reports she has not had body tested for mycotoxins (urine or serum). At risk for continued exposure to mycotoxins (allergenic/ infectious/ toxigenic) in residual mold fragments post remediation.  Protocols used: Nasal Allergies (Hay Fever)-A-AH

## 2024-05-05 ENCOUNTER — Encounter: Admitting: Internal Medicine
